# Patient Record
Sex: Male | Born: 1965 | Race: Black or African American | Hispanic: No | Marital: Married | Smoking: Current some day smoker
Health system: Southern US, Community
[De-identification: ages and names within clinical notes are randomized; demographics above are authoritative.]

## PROBLEM LIST (undated history)

## (undated) DIAGNOSIS — C61 Malignant neoplasm of prostate: Secondary | ICD-10-CM

## (undated) DIAGNOSIS — R351 Nocturia: Secondary | ICD-10-CM

## (undated) DIAGNOSIS — Z973 Presence of spectacles and contact lenses: Secondary | ICD-10-CM

## (undated) DIAGNOSIS — Z8619 Personal history of other infectious and parasitic diseases: Secondary | ICD-10-CM

## (undated) DIAGNOSIS — I252 Old myocardial infarction: Secondary | ICD-10-CM

## (undated) DIAGNOSIS — N401 Enlarged prostate with lower urinary tract symptoms: Secondary | ICD-10-CM

## (undated) DIAGNOSIS — A048 Other specified bacterial intestinal infections: Secondary | ICD-10-CM

## (undated) DIAGNOSIS — J309 Allergic rhinitis, unspecified: Secondary | ICD-10-CM

## (undated) DIAGNOSIS — E785 Hyperlipidemia, unspecified: Secondary | ICD-10-CM

## (undated) DIAGNOSIS — R3129 Other microscopic hematuria: Secondary | ICD-10-CM

## (undated) DIAGNOSIS — T7840XA Allergy, unspecified, initial encounter: Secondary | ICD-10-CM

## (undated) DIAGNOSIS — R350 Frequency of micturition: Secondary | ICD-10-CM

## (undated) HISTORY — DX: Other microscopic hematuria: R31.29

## (undated) HISTORY — DX: Allergy, unspecified, initial encounter: T78.40XA

## (undated) HISTORY — PX: CARDIAC CATHETERIZATION: SHX172

## (undated) HISTORY — DX: Hyperlipidemia, unspecified: E78.5

## (undated) HISTORY — PX: PROSTATE BIOPSY: SHX241

## (undated) HISTORY — DX: Other specified bacterial intestinal infections: A04.8

## (undated) HISTORY — DX: Old myocardial infarction: I25.2

---

## 2000-08-23 ENCOUNTER — Encounter: Payer: Self-pay | Admitting: Family Medicine

## 2000-08-23 ENCOUNTER — Ambulatory Visit (HOSPITAL_COMMUNITY): Admission: RE | Admit: 2000-08-23 | Discharge: 2000-08-23 | Payer: Self-pay | Admitting: Family Medicine

## 2003-03-19 ENCOUNTER — Emergency Department (HOSPITAL_COMMUNITY): Admission: EM | Admit: 2003-03-19 | Discharge: 2003-03-19 | Payer: Self-pay | Admitting: Emergency Medicine

## 2004-03-21 ENCOUNTER — Emergency Department (HOSPITAL_COMMUNITY): Admission: EM | Admit: 2004-03-21 | Discharge: 2004-03-22 | Payer: Self-pay | Admitting: Emergency Medicine

## 2004-10-04 ENCOUNTER — Ambulatory Visit: Payer: Self-pay | Admitting: Internal Medicine

## 2005-02-25 ENCOUNTER — Emergency Department (HOSPITAL_COMMUNITY): Admission: EM | Admit: 2005-02-25 | Discharge: 2005-02-25 | Payer: Self-pay | Admitting: Family Medicine

## 2005-09-15 ENCOUNTER — Inpatient Hospital Stay (HOSPITAL_COMMUNITY): Admission: EM | Admit: 2005-09-15 | Discharge: 2005-09-16 | Payer: Self-pay | Admitting: Emergency Medicine

## 2005-09-15 ENCOUNTER — Ambulatory Visit: Payer: Self-pay | Admitting: Cardiology

## 2005-10-17 ENCOUNTER — Ambulatory Visit: Payer: Self-pay | Admitting: Internal Medicine

## 2005-10-26 ENCOUNTER — Ambulatory Visit: Payer: Self-pay | Admitting: Internal Medicine

## 2006-01-15 ENCOUNTER — Emergency Department (HOSPITAL_COMMUNITY): Admission: EM | Admit: 2006-01-15 | Discharge: 2006-01-15 | Payer: Self-pay | Admitting: Family Medicine

## 2006-06-12 ENCOUNTER — Ambulatory Visit: Payer: Self-pay | Admitting: Internal Medicine

## 2006-06-19 ENCOUNTER — Ambulatory Visit: Payer: Self-pay | Admitting: Internal Medicine

## 2006-06-19 LAB — CONVERTED CEMR LAB
ALT: 29 units/L (ref 0–40)
BUN: 13 mg/dL (ref 6–23)
Basophils Absolute: 0.1 10*3/uL (ref 0.0–0.1)
Bilirubin, Direct: 0.2 mg/dL (ref 0.0–0.3)
Calcium: 9 mg/dL (ref 8.4–10.5)
Cholesterol: 162 mg/dL (ref 0–200)
Eosinophils Absolute: 0.2 10*3/uL (ref 0.0–0.6)
GFR calc Af Amer: 105 mL/min
GFR calc non Af Amer: 87 mL/min
Glucose, Bld: 96 mg/dL (ref 70–99)
Ketones, ur: NEGATIVE mg/dL
LDL Cholesterol: 108 mg/dL — ABNORMAL HIGH (ref 0–99)
Leukocytes, UA: NEGATIVE
MCHC: 33.8 g/dL (ref 30.0–36.0)
MCV: 87.2 fL (ref 78.0–100.0)
Platelets: 395 10*3/uL (ref 150–400)
RBC: 4.57 M/uL (ref 4.22–5.81)
RDW: 11.6 % (ref 11.5–14.6)
Specific Gravity, Urine: >=1.03 (ref 1.000–1.03)
Total CHOL/HDL Ratio: 4.8
Triglycerides: 98 mg/dL (ref 0–149)
WBC: 5.1 10*3/uL (ref 4.5–10.5)
pH: 6 (ref 5.0–8.0)

## 2006-06-21 ENCOUNTER — Ambulatory Visit: Payer: Self-pay | Admitting: Internal Medicine

## 2006-11-27 ENCOUNTER — Ambulatory Visit: Payer: Self-pay | Admitting: Internal Medicine

## 2006-12-25 ENCOUNTER — Ambulatory Visit: Payer: Self-pay | Admitting: Internal Medicine

## 2006-12-25 LAB — CONVERTED CEMR LAB
Crystals: NEGATIVE
Ketones, ur: NEGATIVE mg/dL
Leukocytes, UA: NEGATIVE
Specific Gravity, Urine: 1.02 (ref 1.000–1.03)
Urine Glucose: NEGATIVE mg/dL
Urobilinogen, UA: 4 — AB (ref 0.0–1.0)
WBC, UA: NONE SEEN cells/hpf
pH: 7 (ref 5.0–8.0)

## 2007-05-23 ENCOUNTER — Telehealth (INDEPENDENT_AMBULATORY_CARE_PROVIDER_SITE_OTHER): Payer: Self-pay | Admitting: *Deleted

## 2007-05-23 ENCOUNTER — Ambulatory Visit: Payer: Self-pay | Admitting: Internal Medicine

## 2007-05-23 DIAGNOSIS — I252 Old myocardial infarction: Secondary | ICD-10-CM

## 2007-05-23 DIAGNOSIS — J309 Allergic rhinitis, unspecified: Secondary | ICD-10-CM | POA: Insufficient documentation

## 2007-05-23 DIAGNOSIS — E785 Hyperlipidemia, unspecified: Secondary | ICD-10-CM

## 2007-05-23 DIAGNOSIS — A048 Other specified bacterial intestinal infections: Secondary | ICD-10-CM | POA: Insufficient documentation

## 2007-05-23 DIAGNOSIS — K5289 Other specified noninfective gastroenteritis and colitis: Secondary | ICD-10-CM

## 2008-03-11 ENCOUNTER — Emergency Department (HOSPITAL_COMMUNITY): Admission: EM | Admit: 2008-03-11 | Discharge: 2008-03-11 | Payer: Self-pay | Admitting: Emergency Medicine

## 2008-03-11 ENCOUNTER — Telehealth: Payer: Self-pay | Admitting: Internal Medicine

## 2008-08-11 ENCOUNTER — Ambulatory Visit: Payer: Self-pay | Admitting: Internal Medicine

## 2008-12-30 ENCOUNTER — Telehealth: Payer: Self-pay | Admitting: Internal Medicine

## 2009-07-22 ENCOUNTER — Ambulatory Visit: Payer: Self-pay | Admitting: Internal Medicine

## 2009-07-22 DIAGNOSIS — J45909 Unspecified asthma, uncomplicated: Secondary | ICD-10-CM | POA: Insufficient documentation

## 2009-08-19 HISTORY — PX: ACHILLES TENDON REPAIR: SUR1153

## 2009-09-14 ENCOUNTER — Emergency Department (HOSPITAL_COMMUNITY)
Admission: EM | Admit: 2009-09-14 | Discharge: 2009-09-14 | Payer: Self-pay | Source: Home / Self Care | Admitting: Emergency Medicine

## 2009-10-01 ENCOUNTER — Encounter (INDEPENDENT_AMBULATORY_CARE_PROVIDER_SITE_OTHER): Payer: Self-pay | Admitting: Orthopedic Surgery

## 2009-10-01 ENCOUNTER — Ambulatory Visit: Payer: Self-pay | Admitting: Surgery

## 2009-10-01 ENCOUNTER — Encounter: Payer: Self-pay | Admitting: Internal Medicine

## 2009-10-01 ENCOUNTER — Ambulatory Visit: Admission: RE | Admit: 2009-10-01 | Discharge: 2009-10-01 | Payer: Self-pay | Admitting: Orthopedic Surgery

## 2009-10-05 ENCOUNTER — Ambulatory Visit: Payer: Self-pay | Admitting: Internal Medicine

## 2009-12-06 ENCOUNTER — Emergency Department (HOSPITAL_COMMUNITY): Admission: EM | Admit: 2009-12-06 | Discharge: 2009-12-06 | Payer: Self-pay | Admitting: Family Medicine

## 2010-01-08 ENCOUNTER — Encounter: Payer: Self-pay | Admitting: Internal Medicine

## 2010-01-08 ENCOUNTER — Ambulatory Visit: Payer: Self-pay | Admitting: Internal Medicine

## 2010-04-11 ENCOUNTER — Encounter: Payer: Self-pay | Admitting: Internal Medicine

## 2010-04-18 LAB — CONVERTED CEMR LAB
AST: 25 units/L (ref 0–37)
Albumin: 3.9 g/dL (ref 3.5–5.2)
Alkaline Phosphatase: 75 units/L (ref 39–117)
Basophils Absolute: 0 10*3/uL (ref 0.0–0.1)
Basophils Relative: 0.2 % (ref 0.0–3.0)
Bilirubin, Direct: 0.2 mg/dL (ref 0.0–0.3)
Calcium: 8.9 mg/dL (ref 8.4–10.5)
GFR calc non Af Amer: 117.53 mL/min (ref >60)
Hemoglobin: 13.7 g/dL (ref 13.0–17.0)
Lymphocytes Relative: 41.3 % (ref 12.0–46.0)
Monocytes Relative: 11.7 % (ref 3.0–12.0)
Neutro Abs: 2 10*3/uL (ref 1.4–7.7)
PSA: 1.32 ng/mL (ref 0.10–4.00)
RBC: 4.51 M/uL (ref 4.22–5.81)
Sodium: 142 meq/L (ref 135–145)
TSH: 1.08 microintl units/mL (ref 0.35–5.50)
Total CHOL/HDL Ratio: 5
Total Protein: 7.4 g/dL (ref 6.0–8.3)
VLDL: 17.2 mg/dL (ref 0.0–40.0)
WBC: 5 10*3/uL (ref 4.5–10.5)

## 2010-04-20 NOTE — Letter (Signed)
Summary: [facility]   Imported By: Maryln Gottron 10/27/2009 13:11:41  _____________________________________________________________________  External Attachment:    Type:   Image     Comment:   External Document

## 2010-04-20 NOTE — Assessment & Plan Note (Signed)
Summary: 3 month follow up/mhf   Vital Signs:  Patient profile:   45 year old male Height:      67 inches Weight:      184.50 pounds BMI:     29.00 O2 Sat:      99 % on Room air Temp:     98.0 degrees F oral Pulse rate:   76 / minute Pulse rhythm:   regular Resp:     20 per minute BP sitting:   102 / 78  (right arm) Cuff size:   large  Vitals Entered By: Glendell Docker CMA (January 08, 2010 10:11 AM)  O2 Flow:  Room air CC: 3 Month follow up , asthma Is Patient Diabetic? No Pain Assessment Patient in pain? no        Primary Care Provider:  Dondra Spry DO  CC:  3 Month follow up  and asthma.  History of Present Illness:       This is a 45 year old male who presents with asthma.  The patient complains of history of diagnosed Asthma and wheezing.  Previous effective treatment includes short acting beta-agonist:.  he has not been using qvar as directed.   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 Pylori  Past Surgical History: left achilles tendon repair 08/2009  - Dr. 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  Physical Exam  General:  alert, well-developed, and well-nourished.   Lungs:  normal respiratory effort and normal breath sounds.   Heart:  normal rate, regular rhythm, and no gallop.   Extremities:  no edema    Impression & Recommendations:  Problem # 1:  ASTHMA  (ICD-493.90) spirometry confirms mild obstruction.  stressed importance of using maintenace inhaler daily  The following medications were removed from the medication list:    Qvar 40 Mcg/act Aers (Beclomethasone dipropionate) .Marland Kitchen... 2 puffs two times a day His updated medication list for this problem includes:    Proair Hfa 108 (90 Base) Mcg/act Aers (Albuterol sulfate) .Marland Kitchen... 2 puffs qid prn    Dulera 100-5 Mcg/act Aero (Mometasone furo-formoterol fum) .Marland Kitchen... 2 puffs two times a day  Complete Medication List: 1)  Proair Hfa 108 (90 Base) Mcg/act Aers (Albuterol sulfate) .... 2 puffs qid prn 2)  Zyrtec Allergy 10 Mg Tbdp (Cetirizine hcl) .... Take 1 tablet by mouth once a day as needed 3)  Dulera 100-5 Mcg/act Aero (Mometasone furo-formoterol fum) .... 2 puffs two times a day  Other Orders: Flu Vaccine 66yrs + ([address]) Admin 1st Vaccine ([address])  Patient Instructions: 1)  Please schedule a follow-up appointment in 6 months. 2)  Allergy panel blood test - 1 week prior to next appointment Prescriptions: DULERA 100-5 MCG/ACT AERO (MOMETASONE FURO-FORMOTEROL FUM) 2 puffs two times a day  #1 x 5   Entered and Authorized by:   D.  Thomos Lemons DO   Signed by:   D. Thomos Lemons DO on 01/08/2010   Method used:   Electronically to        [facility] (retail)       [address]       [place], [place]  [address]       Ph: [phone]       Fax: [phone]   RxID:   [id number]    Orders Added: 1)  Flu Vaccine 6yrs + [[address]] 2)  Admin 1st Vaccine [90471] 3)  Est. Patient Level [name] [02774]   Immunizations Administered:  Influenza Vaccine # 1:    Vaccine Type: Fluvax 3+    Site: left deltoid    Mfr: GlaxoSmithKline    Dose: 0.5 ml    Route: IM    Given by: Glendell Docker CMA    Exp. Date: 09/18/2010    Lot #: [id number]    VIS given: 10/13/09 version given January 08, 2010.  Flu Vaccine Consent Questions:    Do you have a history of severe allergic  reactions to this vaccine? no    Any prior history of allergic reactions to egg and/or gelatin? no    Do you have a sensitivity to the preservative Thimersol? no    Do you have a past history of Guillan-Barre Syndrome? no    Do you currently have an acute febrile illness? no    Have you ever had a severe reaction to latex? no    Vaccine information given and explained to patient? yes   Immunizations Administered:  Influenza Vaccine # 1:    Vaccine Type: Fluvax 3+    Site: left deltoid    Mfr: GlaxoSmithKline    Dose: 0.5 ml    Route: IM    Given by: Glendell Docker CMA    Exp. Date: 09/18/2010    Lot #: [id number]    VIS given: 10/13/09 version given January 08, 2010.  Current Allergies (reviewed today): No known allergies

## 2010-04-20 NOTE — Assessment & Plan Note (Signed)
Summary: having trouble breathing out of asthma meds/dt   Vital Signs:  Patient profile:   45 year old male Height:      67 inches Weight:      186 pounds BMI:     29.24 O2 Sat:      97 % on Room air Temp:     97.7 degrees F oral Pulse rate:   86 / minute Pulse rhythm:   regular Resp:     20 per minute BP sitting:   108 / 78  (left arm) Cuff size:   large  Vitals Entered By: Glendell Docker CMA (Jul 22, 2009 11:32 AM)  O2 Flow:  Room air CC: Rm 3- Asthma flare up Is Patient Diabetic? No  Does patient need assistance? 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  History of Present Illness:       The patient presents with asthma exacerbation.  The patient complains of history of diagnosed Asthma, chest tightness, wheezing, and mucous production.  Associated symptoms include nasal congestion.  Precipitating event is recent infection.    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  Physical Exam  General:  alert, well-developed, and well-nourished.   Head:  normocephalic and atraumatic.   Ears:  R ear normal and L ear normal.   Mouth:  pharyngeal erythema.   Neck:  No deformities, masses, or tenderness noted. Lungs:   normal respiratory effort.  faint scattered exp wheeze Heart:  normal rate, regular rhythm, and no gallop.   Extremities:  no edema  Neurologic:  cranial nerves II-XII intact and gait normal.     Impression & Recommendations:  Problem # 1:  ASTHMA (ICD-493.90) Assessment Deteriorated Pt with ashtma flare.  I suspect triggered by URI.  start symbicord.  tx with abx and prednisone taper.  Patient advised to call office if symptoms persist or worsen.  His updated medication list for this problem includes:    Proair Hfa 108 (90 Base) Mcg/act Aers (Albuterol sulfate) .Marland Kitchen... 2 puffs qid prn    Symbicort 80-4.5 Mcg/act Aero (Budesonide-formoterol fumarate) .Marland Kitchen... 2 puffs two times a day    Prednisone 10 Mg Tabs (Prednisone) .Marland KitchenMarland Kitchen[facility] Kitchen[facility] Kitchen 3 tabs by mouth once daily x 3 days, 2 tabs by mouth once daily x 3 days, 1 tab by mouth once daily x 3 days  Complete Medication List: 1)  Proair Hfa 108 (90 Base) Mcg/act Aers (Albuterol sulfate) .... 2 puffs qid prn 2)  Zyrtec Allergy 10 Mg Tbdp (Cetirizine hcl) .... Take 1 tablet by mouth once a day as needed 3)  Symbicort 80-4.5 Mcg/act Aero (Budesonide-formoterol fumarate) .... 2 puffs two times a day 4)  Prednisone 10 Mg Tabs (Prednisone) .Marland KitchenMarland Kitchen[facility]land Kitchen  3 tabs by mouth once daily x 3 days, 2 tabs by mouth once daily x 3 days, 1 tab by mouth once daily x 3 days 5)  Azithromycin 250 Mg Tabs (Azithromycin) .... 2 tabs on day one, then one by mouth qd  Patient Instructions: 1)  Please schedule a follow-up appointment in 2 months. Prescriptions: AZITHROMYCIN 250 MG TABS (AZITHROMYCIN) 2 tabs on day one, then one by mouth qd  #6 x 0   Entered and Authorized by:   D. Thomos Lemons DO   Signed by:   D. Thomos Lemons DO on 07/22/2009   Method used:   Electronically to        [facility] (retail)       [address]       [place], [place]  [address]       Ph: [phone]       Fax: [phone]   RxID:   [id number] PREDNISONE 10 MG TABS  (PREDNISONE) 3 tabs by mouth once daily x 3 days, 2 tabs by mouth once daily x 3 days, 1 tab by mouth once daily x 3 days  #18 x 0   Entered and Authorized by:   D. Thomos Lemons DO   Signed by:   D. Thomos Lemons DO on 07/22/2009   Method used:   Electronically to        [facility] (retail)       [address]       [place], [place]  [address]       Ph: [phone]       Fax: [phone]   RxID:   [id number] PROAIR HFA 108 (90 BASE) MCG/ACT AERS (ALBUTEROL SULFATE) 2 puffs qid prn  #1 x 2   Entered and Authorized by:   D. Thomos Lemons DO   Signed by:   D. Thomos Lemons DO on 07/22/2009   Method used:   Electronically to        [facility] (retail)       [address]       [place], [place]  [address]       Ph: [phone]       Fax: [phone]   RxID:   [id number] SYMBICORT 80-4.5 MCG/ACT AERO (BUDESONIDE-FORMOTEROL FUMARATE) 2 puffs two times a day  #1 x 3   Entered and Authorized by:   D. Thomos Lemons DO   Signed by:   D. Thomos Lemons DO on 07/22/2009   Method used:   Electronically to        [facility] (retail)       [address]       [place], [place]  [address]       Ph: [phone]       Fax: [phone]   RxID:   [id number]       Current Allergies (reviewed today): No known allergies

## 2010-04-20 NOTE — Assessment & Plan Note (Signed)
Summary: 2 month fu/dt   Vital Signs:  Patient profile:   45 year old male O2 Sat:      97 % on Room air Temp:     98.0 degrees F oral Pulse rate:   78 / minute Pulse rhythm:   regular Resp:     16 per minute BP sitting:   100 / 70  (left arm) Cuff size:   large  Vitals Entered By: Glendell Docker CMA (October 05, 2009 9:38 AM)  O2 Flow:  Room air CC: Rm 2- 2 Month Follow Is Patient Diabetic? No Comments left foot injury, tore achilles tendon, starts physical therapy tomorrow   Primary Care Provider:  DThomos Lemons DO  CC:  Rm 2- 2 Month Follow.  History of Present Illness: 45 y/o male for asthma f/u no shortness of breath no wheezing  Interval hx:  tore left achilles tendon surgical repair by Dr. 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 Pylori  Past Surgical History: left achilles tendon repair 08/2009  - Dr. 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   Lungs:  normal respiratory effort and normal breath sounds.   Heart:  normal rate, regular rhythm, and no gallop.   Extremities:  no edema    Impression & Recommendations:  Problem # 1:  ASTHMA (ICD-493.90) Assessment Improved continue maintenance inhaler  The following medications were removed from the medication list:    Prednisone 10 Mg Tabs (Prednisone) .Marland KitchenMarland KitchenMarland KitchenMarland Kitchen 3 tabs  by mouth once daily x 3 days, 2 tabs by mouth once daily x 3 days, 1 tab by mouth once daily x 3 days His updated medication list for this problem includes:    Proair Hfa 108 (90 Base) Mcg/act Aers (Albuterol sulfate) .Marland Kitchen... 2 puffs qid prn    Qvar 40 Mcg/act Aers (Beclomethasone dipropionate) .Marland Kitchen... 2 puffs two times a day  Complete Medication List: 1)  Proair Hfa 108 (90 Base) Mcg/act Aers (Albuterol sulfate) .... 2 puffs qid prn 2)  Zyrtec Allergy 10 Mg Tbdp (Cetirizine hcl) .... Take 1 tablet by mouth once a day as needed 3)  Qvar 40 Mcg/act Aers (Beclomethasone dipropionate) .... 2 puffs two times a day 4)  Oxycodone-acetaminophen 5-325 Mg Tabs (Oxycodone-acetaminophen) .Marland Kitchen.. 1-2 tablets by mouth every 4 hours as needed pain 5)  Aspirin 325 Mg Tabs (Aspirin) .... Take 1 tablet by mouth once a day  Patient Instructions: 1)  Please schedule a follow-up appointment in 4 months. Prescriptions: QVAR 40 MCG/ACT AERS (BECLOMETHASONE DIPROPIONATE) 2 puffs two times a day  #1 x 3   Entered and Authorized by:   D. Thomos Lemons DO   Signed by:   D. Thomos Lemons DO on 10/05/2009   Method used:   Electronically to        [facility] (  retail)       [address]       [place], [place]  [address]       Ph: [phone]       Fax: [phone]   RxID:   [id number]   Current Allergies (reviewed today): No known allergies

## 2010-06-07 ENCOUNTER — Ambulatory Visit (INDEPENDENT_AMBULATORY_CARE_PROVIDER_SITE_OTHER): Payer: Private Health Insurance - Indemnity | Admitting: Internal Medicine

## 2010-06-07 ENCOUNTER — Encounter: Payer: Self-pay | Admitting: Internal Medicine

## 2010-06-07 DIAGNOSIS — J45909 Unspecified asthma, uncomplicated: Secondary | ICD-10-CM

## 2010-06-07 DIAGNOSIS — L0233 Carbuncle of buttock: Secondary | ICD-10-CM

## 2010-06-21 ENCOUNTER — Ambulatory Visit: Payer: Self-pay | Admitting: Internal Medicine

## 2010-06-22 NOTE — Assessment & Plan Note (Signed)
Summary: refill asthama meds.Raymond Torres   Vital Signs:  Patient profile:   45 year old male Height:      67 inches Weight:      179.50 pounds BMI:     28.22 O2 Sat:      100 % on Room air Temp:     97.7 degrees F oral Pulse rate:   66 / minute Resp:     18 per minute BP sitting:   100 / 80  (left arm) Cuff size:   large  Vitals Entered By: Glendell Docker CMA (June 07, 2010 10:08 AM)  O2 Flow:  Room air CC: Medication Refill, asthma Is Patient Diabetic? No Pain Assessment Patient in pain? no        Primary Care Provider:  Dondra Spry DO  CC:  Medication Refill and asthma.  History of Present Illness:       This is a 45 year old male who presents with asthma.  The patient denies cough, shortness of breath, chest tightness, wheezing, and mucous production.  Previous effective treatment includes ICS + LABA.  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 Pylori   Past Surgical History: left achilles tendon repair 08/2009  - Dr. 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  Physical Exam  General:  alert, well-developed, and well-nourished.   Ears:  R ear normal and L ear normal.   Mouth:  pharynx pink and moist.   Lungs:  normal respiratory effort and normal  breath sounds.   Heart:  normal rate, regular rhythm, and no gallop.   Skin:  small boil at crease.  no surrounding erythema.     Impression & Recommendations:  Problem # 1:  CARBUNCLE, BUTTOCK (ICD-680.5) Assessment New small boil. use sitz bath Patient advised to call office if symptoms persist or worsen.  Problem # 2:  ASTHMA (ICD-493.90) Assessment: Improved good response to dulera.  Maintain current medication regimen.  His updated medication list for this problem includes:    Proair Hfa 108 (90 Base) Mcg/act Aers (Albuterol sulfate) .Marland Kitchen... 2 puffs qid prn    Dulera 100-5 Mcg/act Aero (Mometasone furo-formoterol fum) .Marland Kitchen... 2 puffs two times a day  Complete Medication List: 1)  Proair Hfa 108 (90 Base) Mcg/act Aers (Albuterol sulfate) .... 2 puffs qid prn 2)  Allegra Allergy 180 Mg Tabs (Fexofenadine hcl) .... One by mouth once daily 3)  Dulera 100-5 Mcg/act Aero (Mometasone furo-formoterol fum) .... 2 puffs two times a day  Patient Instructions: 1)  Soak in sitz bath once daily x 3-5 days 2)  Call our office if your symptoms do not  improve or gets worse. 3)  Use fexofenadine 180 mg once daily  4)  Please schedule a follow-up appointment in 6 months. Prescriptions: DULERA 100-5 MCG/ACT AERO (MOMETASONE FURO-FORMOTEROL FUM) 2 puffs two times a day  #1 x 5   Entered and Authorized by:   D. Thomos Lemons DO   Signed by:   D. Thomos Lemons DO on 06/07/2010   Method used:   Print then Give to Patient   RxID:   [id number]    Orders Added: 1)  Est. Patient Level [name] [[address]]

## 2010-07-29 ENCOUNTER — Encounter: Payer: Self-pay | Admitting: Internal Medicine

## 2010-07-30 ENCOUNTER — Ambulatory Visit: Payer: Private Health Insurance - Indemnity | Admitting: Internal Medicine

## 2010-07-30 ENCOUNTER — Ambulatory Visit (INDEPENDENT_AMBULATORY_CARE_PROVIDER_SITE_OTHER): Payer: Private Health Insurance - Indemnity | Admitting: Family

## 2010-07-30 ENCOUNTER — Other Ambulatory Visit: Payer: Self-pay | Admitting: Family

## 2010-07-30 ENCOUNTER — Encounter: Payer: Self-pay | Admitting: Family

## 2010-07-30 VITALS — BP 108/78 | HR 78 | Temp 97.8°F | Resp 16 | Ht 67.01 in | Wt 181.1 lb

## 2010-07-30 DIAGNOSIS — N390 Urinary tract infection, site not specified: Secondary | ICD-10-CM | POA: Insufficient documentation

## 2010-07-30 DIAGNOSIS — R300 Dysuria: Secondary | ICD-10-CM

## 2010-07-30 LAB — POCT URINALYSIS DIPSTICK
Glucose, UA: NEGATIVE
Nitrite, UA: NEGATIVE
Urobilinogen, UA: 0.2

## 2010-07-30 MED ORDER — CIPROFLOXACIN HCL 500 MG PO TABS: 500.0000 mg | ORAL_TABLET | Freq: Two times a day (BID) | ORAL | Status: AC

## 2010-07-30 NOTE — Progress Notes (Signed)
Addended by: Mervin Kung on: 07/30/2010 09:40 AM   Modules accepted: Orders

## 2010-07-30 NOTE — Progress Notes (Signed)
  Subjective:    Patient ID: Raymond Torres, male    DOB: Oct 16, 1965, 45 y.o.   MRN: [id number]  HPI  Mr.  Torres is a 45 yr old male with complaint of urinary frequency, urgency, and dysuria. Symptoms started 1 week ago.  Denies associated low back pain, hematuria or fever.  Denies new sexual partners. No history of similar symptoms and more  specifically denies hx of kidney stones.    Review of Systems  See HPI  Past Medical History  Diagnosis Date  . Asthma childhood  . Hyperlipidemia   . 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 Microscopic hematuria     history of  . Helicobacter pylori (H. pylori)     history of  . Allergy     rhinitis    History   Social History  . Marital Status: Married    Spouse Name: N/A    Number of Children: 3  . Years of Education: N/A   Occupational History  . Manager    Social History Main Topics  . Smoking status: Never Smoker   . Smokeless tobacco: Not on file  . Alcohol Use: Yes  . Drug Use: No  . Sexually Active:    Other Topics Concern  . Not on file   Social History Narrative  . No narrative on file    Past Surgical History  Procedure Date  . Achilles tendon repair 08/2009    Dr August Saucer    Family History  Problem Relation Age of Onset  . Hypertension Mother   . Cancer Father     lung and prostate  . Cancer Other     prostate    No Known Allergies  Current Outpatient Prescriptions on File Prior to Visit  Medication Sig Dispense Refill  . albuterol (PROAIR HFA) 108 (90 BASE) MCG/ACT inhaler Inhale 2 puffs into the lungs every 6 (six) hours as needed.        . cetirizine (ZYRTEC) 10 MG tablet Take 10 mg by mouth daily as needed.        . Mometasone Furo-Formoterol Fum (DULERA) 100-5 MCG/ACT AERO Inhale 2 puffs into the lungs 2 (two) times daily.          BP 108/78  Pulse 78  Temp(Src) 97.8 F (36.6 C) (Oral)  Resp 16  Ht 5' 7.01" (1.702 m)  Wt 181 lb 1.9 oz (82.155 kg)  BMI  28.36 kg/m2        Objective:   Physical Exam  Gen: awake, alert, NAD CV:  S1, S2, RRR, no murmur Resp:  BS CTA bilaterally, no wheezes, rales or rhonchi. Abd: soft, non-tender, non-distended, + BS GU: neg CVAT bilaterally          Assessment & Plan:

## 2010-07-30 NOTE — Assessment & Plan Note (Signed)
UA suggestive or UTI.  + microscopic  hematuria.  Will send for culture including GC/Chlamydia screen.  Pt is a non-smoker and without back pain, fever or CVAT.  Will defer CT scan at this time, however if symptoms fail to improve, he will ultimately need CT with kidney stone protocol.  Pt is aware of plan and is instructed to follow up with Dr. Artist Pais in 2 weeks, sooner if symptoms worsen or if they do not improve.

## 2010-07-30 NOTE — Patient Instructions (Signed)
Please call if your symptoms worsen, if you develop fever over 101, low back pain, or difficulty urinating. Follow up with Dr. Artist Pais in 2 weeks.

## 2010-07-31 LAB — GC/CHLAMYDIA PROBE AMP, URINE: GC Probe Amp, Urine: NEGATIVE

## 2010-08-02 LAB — URINE CULTURE: Colony Count: >=100000

## 2010-08-03 NOTE — Assessment & Plan Note (Signed)
 HEALTHCARE                         ELECTROPHYSIOLOGY OFFICE NOTE   Raymond Torres, Raymond Torres                         MRN:          [id number]  DATE:11/27/2006                            DOB:          09-15-1965    Raymond Torres returns today for followup.  He is a very pleasant, 45-year-  old male with a non-Q-wave MI sustained over a year ago with  catheterization demonstrating normal LV function and no evidence for  coronary artery disease.  I saw him most recently back in August 2007,  and he returns today for followup.  At that time, we had him on calcium  channel blockers because of concerns about coronary spasm.  These have  been discontinued and he has had no recurrent symptoms.  He is on an  enteric coated aspirin daily.  Otherwise, he had no specific complaints  today.   PHYSICAL EXAMINATION:  GENERAL:  He is a pleasant, well-appearing, young  man in no distress.  VITAL SIGNS:  Blood pressure 132/78, pulse 73 and regular, respirations  18.  Weight 171 pounds.  NECK:  No jugular venous distention.  LUNGS:  Clear to auscultation bilaterally.  No wheezes, rales or rhonchi  were present.  CARDIAC:  Regular rate and rhythm with normal S1, S2, no murmurs, rubs  or gallops.  PMI was not enlarged nor laterally displaced.  ABDOMEN:  Soft, nontender, nondistended.  There is no organomegaly.  EXTREMITIES:  No clubbing, cyanosis or edema.   EKG demonstrates sinus rhythm with normal axis intervals.   IMPRESSION:  1. Remote non-Q-wave myocardial infarction with normal left      ventricular function and normal coronaries.  2. Dyslipidemia with the patient intolerant of statins.   DISCUSSION:  Overall, Raymond Torres is stable.  I have recommended that he  continue his low dose aspirin.  We will see him back on a p.r.n. basis  as he will follow up with his primary physician, Dr. Thomos Lemons.     Doylene Canning. Ladona Ridgel, MD  Electronically Signed    GWT/MedQ  DD:  11/27/2006  DT: 11/28/2006  Job #: [id number]

## 2010-08-06 NOTE — Assessment & Plan Note (Signed)
[facility]                              CARDIOLOGY OFFICE NOTE   AWAIS, COBARRUBIAS                         MRN:          [id number]  DATE:10/26/2005                            DOB:          1965-10-04    Mr. Raymond Torres returns today for followup.  He is a very pleasant, 45 year old  man with a history of recent non-Q wave MI for which he underwent  catheterization demonstrating normal LV function and coronary artery  disease.  He was discharged home.  He has dyslipidemia, but is intolerant of  Lipitor having back pain on it.  He has stopped his Lipitor.  He has had no  recurrent chest pain.   PHYSICAL EXAMINATION:  GENERAL:  He is a pleasant, well-appearing man in no  distress.  VITAL SIGNS:  Blood pressure 104/60, pulse 60 and regular, respirations 18,  weight 167 pounds.  NECK:  No jugular venous distention.  LUNGS:  Clear to auscultation bilaterally.  CARDIAC:  Regular rate and rhythm with normal S1, S2.  EXTREMITIES:  No edema.   EKG demonstrates sinus rhythm.   IMPRESSION:  1.  Status post non-Q-wave myocardial infarction.  2.  Clean coronaries with consideration for possible event being due to      coronary spasm.  3.  Back pain on Lipitor.  4.  Dyslipidemia.   DISCUSSION:  I have recommended that we discontinue his Lipitor for now,  that he maintain a low-fat diet and that he stay on his Cardizem for at  least a year.  We will see him back in a year and think about discontinuing  his Cardizem if he has no other symptoms other than spasm.                                  Doylene Canning. Ladona Ridgel, MD    GWT/MedQ  DD:  10/26/2005  DT:  10/26/2005  Job #:  [id number]

## 2010-08-06 NOTE — Discharge Summary (Signed)
NAMESUNG, PARODI NO.:  [id number]   MEDICAL RECORD NO.:  [id number]          PATIENT TYPE:  INP   LOCATION:  2004                         FACILITY:  [facility]   PHYSICIAN:  Dorian Pod, NP    DATE OF BIRTH:  [birth year]   DATE OF ADMISSION:  09/14/2005  DATE OF DISCHARGE:  09/16/2005                                 DISCHARGE SUMMARY   DISCHARGE DIAGNOSES:  1.  Non-ST elevated myocardial infarction, status post cardiac      catheterization on September 15, 2005.  Patient with a normal ejection      fraction of 60%, angiographically normal coronaries.  Etiology of      elevated troponin/myocardial infarction not certain.  Questionable      coronary spasms.  D-dimer within normal limits.  2.  Mild hypercholesterolemia, with an LDL of 108 and triglycerides within      normal limits.   PAST MEDICAL HISTORY:  As above.  Recent urinary tract infection.   PROCEDURES:  Cardiac catheterization as stated above.   HISTORY OF PRESENT ILLNESS:  Mr. Raymond Torres is a 45 year old African American  gentleman with no known history of coronary artery disease, only history of  asthma, who presented to [facility] emergency room on day of admission  complaining of chest pain that started around 8 o'clock that night while he  was working.  He is a Naval architect.  The pain came very intense.  It  was associated with shortness of breath, nausea.  Relieved with  nitroglycerine in the ER.  Initial EKG at a rate of 51 sinus rhythm.  H&H  within normal limits.  Chemistry within normal limits.  Second troponin  1.11, D-dimer less than 0.22, urinalysis positive for leukocyte.  The  patient was admitted with an non-ST elevated MI, treated with heparin,  aspirin, beta blocker.  Continued Cipro, as he was previously on this for  UTI.   ALLERGIES:  Patient with known allergy to SHELL FISH.   MEDICATIONS:  Previous medication was Solu-Medrol, prednisone, Benadryl,  Pepcid.   To the cath lab  on September 15, 2005, results stated above.  Patient had no  further episodes of chest discomfort.  EKG showed a normal sinus rhythm.  Patient being discharged home after being seen by Dr. Lanice Shirts.  At this  time, we will medicate with Cardizem for possible coronary spasms.   At the time of discharge, afebrile, pulse of 88, blood pressure 104/64.  The  patient sat 98% on room air.   LAB WORK:  Showing a hemoglobin of 14, hematocrit 41.56, WBC of 13.6,  platelet count 304,000.  D-dimer less than 0.22 x2.  Sodium 136, potassium  4.1, chloride 108, CO2 of 22, glucose elevated at 186.  Patient to followup  with his primary care physician regarding elevated glucose.  BUN 14,  creatinine 1.1, troponin peak at 1.50, with a CK MB of 18.8 and an index of  4.7.  Total cholesterol 149, HDL 34, LDL 108, and triglycerides 34.   DISPOSITION:  The patient home with wife.  He has been  giving a postcardiac  catheterization discharge instructions.  He has also been given a post  myocardial infarction instructions regarding his medication, and a article  for treatment of elevated cholesterol.   MEDICATIONS AT DISCHARGE:  Include prescriptions for:  1.  Nitroglycerine sublingual as needed.  2.  Cardizem CD 180 mg daily.  3.  Lipitor 40 mg at bedtime.  4.  Patient to continue Cipro as previously prescribed.  5.  He will use over-the-counter aspirin 81 mg daily.   The patient is instructed to avoid caffeine, decongestants, any stimulants.  He is scheduled for blood work July 30th, 8:30 a.m., for lipids and liver.  He has been instructed not to eat or drink anything 8 hours before.  Followup with Dr. Ladona Ridgel August 8th at 10:45.  If the patient has any  problems from the cath site before then, he can call our office at [phone].      Dorian Pod, NP     MB/MEDQ  D:  09/16/2005  T:  09/16/2005  Job:  [phone] [id number]

## 2010-08-06 NOTE — H&P (Signed)
NAME:  Raymond Torres, TRICKEL                ACCOUNT NO.:  [id number]   MEDICAL RECORD NO.:  [id number]          PATIENT TYPE:  EMS   LOCATION:  MAJO                         FACILITY:  [facility]   PHYSICIAN:  Leonard Downing. Pernell Dupre, M.D.  DATE OF BIRTH:  May 10, 1965   DATE OF ADMISSION:  09/14/2005  DATE OF DISCHARGE:                                HISTORY & PHYSICAL   CHIEF COMPLAINT:  Chest pain.   HISTORY OF PRESENT ILLNESS:  This is a 45 year old African American male  with a history of asthma and complaining of chest pain.  The patient states  that at 8:00 p.m. tonight he was working at [employer], developed chest  pain described as deep, sharp, 10/10, associated with shortness of breath,  nausea, no vomiting and no diaphoresis.  The pain radiated to his left arm.  He has never had the pain before.  No PND.  No orthopnea.  No lower  extremity edema.  No presyncope or syncope.  The patient states that the  pain lasted for about 1 hour until he came to the ER and received  nitroglycerin.   ALLERGIES:  NO KNOWN DRUG ALLERGIES.   MEDICATIONS:  The only medication that the patient is on is ciprofloxacin.   PAST MEDICAL HISTORY:  1.  Asthma.  2.  Urinary tract infection.   SOCIAL HISTORY:  Lives in [place] with his wife.  He is a Psychologist, occupational.  He has two healthy children.  No tobacco.  No alcohol.  No drugs.   FAMILY HISTORY:  Mother is alive at age 33 with thyroid issues.  Father died  in his 16's for cancer.  One brother who is healthy.   REVIEW OF SYSTEMS:  Chest pain, shortness of breath and nausea.  All other  systems are negative.   PHYSICAL EXAMINATION:  VITAL SIGNS:  Temperature 98.2, pulse 59, respiratory  rate 18, blood pressure 126/89.  GENERAL:  Alert and oriented times 3, in no apparent distress.  HEENT:  Pupils equal, round and reactive to light.  Extraocular movements  intact.  NECK:  No lymphadenopathy and no carotid bruits.  CHEST:  Clear to auscultation  bilaterally.  CARDIOVASCULAR:  Normal S1, S2.  No murmurs, rubs or gallops.  ABDOMEN:  Soft, nontender and nondistended with positive bowel sounds.  EXTREMITIES:  No clubbing, cyanosis or edema.  Sharp radial, femoral and  pedal pulses.   DIAGNOSTIC DATA:  Chest x-ray is pending.  EKG, rate 51, sinus, axis 56,  interval PR 142, QRS 108, QTc of 391.   LABORATORY DATA:  H&H 14 and 42.  Sodium 138, potassium 3.6, BUN of 20,  creatinine 1.2, glucose 103.  MB first was 2.4, then bumped to 7.6.  Troponin-I first 0.05, then bumped to 1.11.  D-dimer less than 0.22.  Urinalysis -- positive leukocytes with some occasional bacteria.   ASSESSMENT:  Non-ST elevation myocardial infarction.   PLAN:  1.  Start heparin drip, nitroglycerin drip, aspirin and start metoprolol      12.5.  Plan for cath in the a.m. and make him NPO.  2.  Check a lipid panel and check a TSH.  3.  Place on telemetry.  Continue to check on enzymes.  Check a urinary      tract screen.  4.  Urinary tract infection.  Continue ciprofloxacin.  5.  He is full code.           ______________________________  Leonard Downing. Pernell Dupre, M.D.     Loralie Champagne  D:  09/15/2005  T:  09/15/2005  Job:  [id number]

## 2010-08-06 NOTE — Cardiovascular Report (Signed)
NAME:  Raymond Torres, LANZ NO.:  [id number]   MEDICAL RECORD NO.:  [id number]          PATIENT TYPE:  INP   LOCATION:  3302                         FACILITY:  [facility]   PHYSICIAN:  Jonelle Sidle, M.D. LHCDATE OF BIRTH:  August 31, 1965   DATE OF PROCEDURE:  09/15/2005  DATE OF DISCHARGE:                              CARDIAC CATHETERIZATION   REQUESTING CARDIOLOGIST:  Dr. Lewayne Bunting   INDICATIONS:  Mr. Hessel is a 44 year old male with available history  indicating asthma and recent chest pain syndrome.  He has ruled in for a non-  ST-elevation myocardial infarction based on abnormal troponin I levels and  is referred now for diagnostic coronary angiography to clearly assess the  coronary anatomy.   Dictation ended at this point.      Jonelle Sidle, M.D. [facility]     SGM/MEDQ  D:  09/15/2005  T:  09/15/2005  Job:  [id number] [id number]

## 2010-08-06 NOTE — Cardiovascular Report (Signed)
NAME:  Raymond Torres, Raymond Torres NO.:  [id number]   MEDICAL RECORD NO.:  [id number]          PATIENT TYPE:  INP   LOCATION:  2004                         FACILITY:  [facility]   PHYSICIAN:  Jonelle Sidle, MD DATE OF BIRTH:  27-Jan-1966   DATE OF PROCEDURE:  01/04/2006  DATE OF DISCHARGE:  09/16/2005                              CARDIAC CATHETERIZATION   INDICATIONS:  Mr. Main is a 44 year old male with available history  indicating asthma, who was admitted to the hospital with complaints of chest  pain, and was ultimately noted to have abnormal cardiac markers suggesting  non-ST elevation myocardial infarction with a troponin I level of 1.5.  He  was evaluated by Dr. Ladona Ridgel and scheduled for cardiac catheterization to  define the coronary anatomy and assess for any revascularizations  strategies.  The risks and benefits were explained him in advance and  informed consent was obtained.   PROCEDURE PERFORMED:  1. Left heart catheterization  2. Selective coronary angiography.  3. Left ventriculography.   ACCESS AND EQUIPMENT:  The area about the right femoral artery was  anesthetized with 1% lidocaine and a 6-French sheath was placed in the right  femoral artery via the modified Seldinger technique.  Standard preformed 6-  [place], JL-4 and JR-4 catheters were used for selective coronary angiography  and angled pigtail catheter was used for left heart catheterization and left  ventriculography.  A total of 110 mL Omnipaque were used.  The patient  tolerated the procedure well without any complications.   HEMODYNAMICS:  Aorta 96/59 mmHg.  Left ventricle 93/6 mmHg.   ANGIOGRAPHIC FINDINGS:  1. The left main coronary artery is free of significant flow-limiting      coronary atherosclerosis and gives rise to left anterior descending and      circumflex coronary arteries.  2. Left anterior descending gives rise to 2 diagonal branches.  There is      no significant  flow-limiting coronary atherosclerosis noted.  3. The circumflex coronary artery gives rise to 2 obtuse marginal      branches.  There is no significant flow-limiting coronary      atherosclerosis noted.  4. The right coronary is dominant providing the posterior descending and      posterolateral branch as well as 2 right ventricular marginal branches.      There is no significant flow-limiting coronary atherosclerosis noted.   Ventriculography was performed in the RAO projection and reveals an ejection  fraction of approximately 60% with no focal anterior or inferior wall motion  abnormality and no significant mitral regurgitation.   DIAGNOSES:  1. Angiographically normal coronary arteries.  2. Left ventricular ejection fraction of approximately 60% with no focal      wall motion abnormality and left ventricular end-diastolic pressure of      6 mmHg, and no significant mitral vegetation.   DISCUSSION:  Reviewed results with the patient as well as with Dr. Ladona Ridgel.  Etiology of the elevated troponin I level is not certain.  Will check a  urine drug screen and D-dimer level and determine whether additional  evaluation such as a chest CT scan is needed.  He will otherwise be observed  in the hospital.      Jonelle Sidle, MD  Electronically Signed     SGM/MEDQ  D:  01/04/2006  T:  01/05/2006  Job:  [id number]

## 2010-12-01 ENCOUNTER — Ambulatory Visit (INDEPENDENT_AMBULATORY_CARE_PROVIDER_SITE_OTHER): Payer: BC Managed Care – PPO | Admitting: Internal Medicine

## 2010-12-01 ENCOUNTER — Encounter: Payer: Self-pay | Admitting: Internal Medicine

## 2010-12-01 VITALS — BP 134/80 | HR 80 | Temp 98.1°F | Wt 179.0 lb

## 2010-12-01 DIAGNOSIS — R519 Headache, unspecified: Secondary | ICD-10-CM | POA: Insufficient documentation

## 2010-12-01 DIAGNOSIS — N342 Other urethritis: Secondary | ICD-10-CM | POA: Insufficient documentation

## 2010-12-01 DIAGNOSIS — R51 Headache: Secondary | ICD-10-CM | POA: Insufficient documentation

## 2010-12-01 DIAGNOSIS — R300 Dysuria: Secondary | ICD-10-CM

## 2010-12-01 LAB — POCT URINALYSIS DIPSTICK
Leukocytes, UA: NEGATIVE
Nitrite, UA: NEGATIVE
Protein, UA: NEGATIVE
Urobilinogen, UA: 0.2

## 2010-12-01 MED ORDER — DOXYCYCLINE HYCLATE 100 MG PO TABS: 100.0000 mg | ORAL_TABLET | Freq: Two times a day (BID) | ORAL | Status: AC

## 2010-12-01 MED ORDER — TRAMADOL HCL 50 MG PO TABS: ORAL_TABLET | ORAL | Status: DC

## 2010-12-01 NOTE — Assessment & Plan Note (Signed)
New onset moderate to severe right headache in 45 year old African American male. Symptoms concerning for intracranial lesion. Headache is awakening patient at night.  Obtain MRI of brain.  Use tramadol as needed for now.

## 2010-12-01 NOTE — Progress Notes (Signed)
Subjective:    Patient ID: Raymond Torres, male    DOB: [birth year], 45 y.o.   MRN: [id number]  Headache  This is a new problem. The current episode started in the past 7 days. The problem occurs daily. The problem has been unchanged. The pain is located in the right unilateral region. The pain does not radiate. The pain quality is not similar to prior headaches. The quality of the pain is described as aching. The pain is at a severity of 8/10. The pain is severe. Pertinent negatives include no back pain, blurred vision, dizziness, ear pain, eye pain, eye redness, fever, hearing loss, loss of balance, nausea, neck pain, photophobia, scalp tenderness or swollen glands. The symptoms are aggravated by nothing. He has tried NSAIDs for the symptoms. The treatment provided no relief.   He also complains of dysuria. No new sexual partners.  UA only significant for trace blood.    Review of Systems  Constitutional: Negative for fever.  HENT: Negative for hearing loss, ear pain and neck pain.   Eyes: Negative for blurred vision, photophobia, pain and redness.  Gastrointestinal: Negative for nausea.  Musculoskeletal: Negative for back pain.  Neurological: Positive for headaches. Negative for dizziness and loss of balance.   Past Medical History  Diagnosis Date  . Asthma childhood  . Hyperlipidemia   . 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 Microscopic hematuria     history of  . Helicobacter pylori (H. pylori)     history of  . Allergy     rhinitis    History   Social History  . Marital Status: Married    Spouse Name: N/A    Number of Children: 3  . Years of Education: N/A   Occupational History  . Manager    Social History Main Topics  . Smoking status: Never Smoker   . Smokeless tobacco: Not on file  . Alcohol Use: Yes  . Drug Use: No  . Sexually Active:    Other Topics Concern  . Not on file   Social History Narrative  . No narrative on  file    Past Surgical History  Procedure Date  . Achilles tendon repair 08/2009    Dr August Saucer    Family History  Problem Relation Age of Onset  . Hypertension Mother   . Cancer Father     lung and prostate  . Cancer Other     prostate    No Known Allergies  Current Outpatient Prescriptions on File Prior to Visit  Medication Sig Dispense Refill  . albuterol (PROAIR HFA) 108 (90 BASE) MCG/ACT inhaler Inhale 2 puffs into the lungs every 6 (six) hours as needed.        . cetirizine (ZYRTEC) 10 MG tablet Take 10 mg by mouth daily as needed.        . Mometasone Furo-Formoterol Fum (DULERA) 100-5 MCG/ACT AERO Inhale 2 puffs into the lungs 2 (two) times daily.          Filed Vitals:   12/01/10 1810  BP: 134/80  Pulse: 80  Temp: 98.1 F (36.7 C)        Objective:   Physical Exam   Constitutional: Appears well-developed and well-nourished. No distress.  Head: Normocephalic and atraumatic.  Right Ear: External ear normal.  Left Ear: External ear normal.  Mouth/Throat: Oropharynx is clear and moist.  Eyes: Conjunctivae are normal. Pupils are equal, round, and reactive to light.  no visual field defect Neck: Normal range of motion. Neck supple. No thyromegaly present. No carotid bruit Cardiovascular: Normal rate, regular rhythm and normal heart sounds.  Exam reveals no gallop and no friction rub.   No murmur heard. Pulmonary/Chest: Effort normal and breath sounds normal.  No wheezes. No rales.  Abdominal: Soft. Bowel sounds are normal. No mass. There is no tenderness.  Neurological: Alert. No cranial nerve deficit.  negative cerebellar signs,  upper and lower extremity reflexes equal and symmetric Skin: Skin is warm and dry.  Psychiatric: Normal mood and affect. Behavior is normal.       Assessment & Plan:

## 2010-12-01 NOTE — Patient Instructions (Signed)
Please call our office if your symptoms do not improve or gets worse.  

## 2010-12-01 NOTE — Assessment & Plan Note (Signed)
45 year old [place] American male with urethritis. UA is unremarkable. Send urine for chlamydia probe. Empiric treatment with doxycycline 100 mg twice daily x10 days

## 2010-12-02 ENCOUNTER — Other Ambulatory Visit: Payer: Self-pay | Admitting: Internal Medicine

## 2010-12-02 DIAGNOSIS — R51 Headache: Secondary | ICD-10-CM

## 2010-12-02 LAB — CHLAMYDIA PROBE AMPLIFICATION, URINE: Chlamydia, Swab/Urine, PCR: NEGATIVE

## 2010-12-08 ENCOUNTER — Ambulatory Visit
Admission: RE | Admit: 2010-12-08 | Discharge: 2010-12-08 | Disposition: A | Discharge status: Home / Self Care | Payer: BC Managed Care – PPO | Source: Ambulatory Visit | Attending: Internal Medicine | Admitting: Internal Medicine

## 2010-12-10 ENCOUNTER — Telehealth: Payer: Self-pay | Admitting: Internal Medicine

## 2010-12-10 NOTE — Telephone Encounter (Signed)
Pt requesting you call him regarding MRI

## 2010-12-22 ENCOUNTER — Encounter: Payer: Self-pay | Admitting: Internal Medicine

## 2010-12-22 ENCOUNTER — Ambulatory Visit (INDEPENDENT_AMBULATORY_CARE_PROVIDER_SITE_OTHER): Payer: BC Managed Care – PPO | Admitting: Internal Medicine

## 2010-12-22 VITALS — BP 114/82 | HR 84 | Temp 98.3°F | Wt 182.0 lb

## 2010-12-22 DIAGNOSIS — Z23 Encounter for immunization: Secondary | ICD-10-CM

## 2010-12-22 DIAGNOSIS — R51 Headache: Secondary | ICD-10-CM

## 2010-12-22 DIAGNOSIS — J45909 Unspecified asthma, uncomplicated: Secondary | ICD-10-CM

## 2010-12-22 MED ORDER — MOMETASONE FURO-FORMOTEROL FUM 100-5 MCG/ACT IN AERO: 2.0000 | INHALATION_SPRAY | Freq: Two times a day (BID) | RESPIRATORY_TRACT | Status: DC

## 2010-12-22 NOTE — Assessment & Plan Note (Signed)
Improved.  MRI of Brain was negative for intracranial lesion.  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

## 2010-12-22 NOTE — Assessment & Plan Note (Addendum)
Well controlled.  Continue maintenance inhaler.  Consider switch to qvar Updated pt with influenza vaccine

## 2010-12-22 NOTE — Patient Instructions (Signed)
Use zyrtec D over the counter as needed

## 2010-12-22 NOTE — Progress Notes (Signed)
  Subjective:    Patient ID: Raymond Torres, male    DOB: 08/14/1965, 45 y.o.   MRN: [id number]  HPI  45 y/o AA male for follow up re:  Severe Headache.  His headache symptoms significantly improved.  MRI of brain reviewed in detail with patient.    He denies symptoms of sinusitis  Asthma - he is using dulera regularly.  He rarely uses rescue inhaler.  Review of Systems Negative for cough or SOB  Past Medical History  Diagnosis Date  . Asthma childhood  . Hyperlipidemia   . 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 Microscopic hematuria     history of  . Helicobacter pylori (H. pylori)     history of  . Allergy     rhinitis    History   Social History  . Marital Status: Married    Spouse Name: N/A    Number of Children: 3  . Years of Education: N/A   Occupational History  . Manager    Social History Main Topics  . Smoking status: Never Smoker   . Smokeless tobacco: Not on file  . Alcohol Use: Yes  . Drug Use: No  . Sexually Active:    Other Topics Concern  . Not on file   Social History Narrative  . No narrative on file    Past Surgical History  Procedure Date  . Achilles tendon repair 08/2009    Dr August Saucer    Family History  Problem Relation Age of Onset  . Hypertension Mother   . Cancer Father     lung and prostate  . Cancer Other     prostate    No Known Allergies  Current Outpatient Prescriptions on File Prior to Visit  Medication Sig Dispense Refill  . albuterol (PROAIR HFA) 108 (90 BASE) MCG/ACT inhaler Inhale 2 puffs into the lungs every 6 (six) hours as needed.        . cetirizine (ZYRTEC) 10 MG tablet Take 10 mg by mouth daily as needed.        . traMADol (ULTRAM) 50 MG tablet Use 2 x daily as needed for headache  30 tablet  0    BP 114/82  Pulse 84  Temp(Src) 98.3 F (36.8 C) (Oral)  Wt 182 lb (82.555 kg)       Objective:   Physical Exam   Constitutional: Appears well-developed and  well-nourished. No distress.  Head: Normocephalic and atraumatic.  Mouth/Throat: Oropharynx is clear and moist.  Eyes: Conjunctivae are normal. Pupils are equal, round, and reactive to light.  Neck: Normal range of motion. Neck supple. No thyromegaly present. No carotid bruit Cardiovascular: Normal rate, regular rhythm and normal heart sounds.  Exam reveals no gallop and no friction rub.  No murmur heard. Pulmonary/Chest: Effort normal and breath sounds normal.  No wheezes. No rales.  Neurological: Alert. No cranial nerve deficit.  Skin: Skin is warm and dry.  Psychiatric: Normal mood and affect. Behavior is normal.        Assessment & Plan:

## 2011-08-18 ENCOUNTER — Other Ambulatory Visit (INDEPENDENT_AMBULATORY_CARE_PROVIDER_SITE_OTHER): Payer: BC Managed Care – PPO

## 2011-08-18 DIAGNOSIS — Z0000 Encounter for general adult medical examination without abnormal findings: Secondary | ICD-10-CM

## 2011-08-18 LAB — CBC WITH DIFFERENTIAL/PLATELET
Basophils Absolute: 0 10*3/uL (ref 0.0–0.1)
HCT: 38.9 % — ABNORMAL LOW (ref 39.0–52.0)
Lymphocytes Relative: 40.6 % (ref 12.0–46.0)
Lymphs Abs: 2.6 10*3/uL (ref 0.7–4.0)
Monocytes Relative: 13 % — ABNORMAL HIGH (ref 3.0–12.0)
Platelets: 302 10*3/uL (ref 150.0–400.0)
RDW: 12.4 % (ref 11.5–14.6)

## 2011-08-18 LAB — HEPATIC FUNCTION PANEL
ALT: 21 U/L (ref 0–53)
Bilirubin, Direct: 0.1 mg/dL (ref 0.0–0.3)
Total Bilirubin: 1 mg/dL (ref 0.3–1.2)
Total Protein: 6.6 g/dL (ref 6.0–8.3)

## 2011-08-18 LAB — BASIC METABOLIC PANEL
BUN: 12 mg/dL (ref 6–23)
Calcium: 8.7 mg/dL (ref 8.4–10.5)
GFR: 119.03 mL/min (ref >60.00)
Glucose, Bld: 71 mg/dL (ref 70–99)

## 2011-08-18 LAB — POCT URINALYSIS DIPSTICK
Bilirubin, UA: NEGATIVE
Ketones, UA: NEGATIVE
Leukocytes, UA: NEGATIVE
Spec Grav, UA: 1.025

## 2011-08-18 LAB — TSH: TSH: 1 u[IU]/mL (ref 0.35–5.50)

## 2011-08-18 LAB — LIPID PANEL
Cholesterol: 146 mg/dL (ref 0–200)
Triglycerides: 102 mg/dL (ref 0.0–149.0)

## 2011-08-25 ENCOUNTER — Ambulatory Visit (INDEPENDENT_AMBULATORY_CARE_PROVIDER_SITE_OTHER): Payer: BC Managed Care – PPO | Admitting: Internal Medicine

## 2011-08-25 ENCOUNTER — Encounter: Payer: Self-pay | Admitting: Internal Medicine

## 2011-08-25 VITALS — BP 104/78 | HR 72 | Temp 98.1°F | Ht 67.0 in | Wt 179.0 lb

## 2011-08-25 DIAGNOSIS — D649 Anemia, unspecified: Secondary | ICD-10-CM

## 2011-08-25 DIAGNOSIS — R3129 Other microscopic hematuria: Secondary | ICD-10-CM | POA: Insufficient documentation

## 2011-08-25 DIAGNOSIS — Z0000 Encounter for general adult medical examination without abnormal findings: Secondary | ICD-10-CM | POA: Insufficient documentation

## 2011-08-25 DIAGNOSIS — R195 Other fecal abnormalities: Secondary | ICD-10-CM

## 2011-08-25 MED ORDER — MOMETASONE FURO-FORMOTEROL FUM 100-5 MCG/ACT IN AERO: 2.0000 | INHALATION_SPRAY | Freq: Two times a day (BID) | RESPIRATORY_TRACT | Status: DC

## 2011-08-25 NOTE — Assessment & Plan Note (Signed)
46 year old [place] American male has mild unexplained normocytic anemia. He has guaiac positive stools. Rule out colonic lesion. Refer to GI for colonoscopy.

## 2011-08-25 NOTE — Assessment & Plan Note (Signed)
Reviewed adult health maintenance protocols.  He has family hx of prostate cancer.  Over the past 3-5 years there has been slight increase in PSA. Current level is 2.0. Digital rectal exam was normal. Repeat screening in one year.  Patient is up-to-date with tetanus vaccine. Lipid panel is acceptable. Blood sugar is normal.

## 2011-08-25 NOTE — Assessment & Plan Note (Signed)
Patient has 1+ blood on urine dip. Arrange full urinalysis with microscopy in one month.

## 2011-08-25 NOTE — Progress Notes (Signed)
Subjective:    Patient ID: Raymond Torres, male    DOB: 02/12/1966, 46 y.o.   MRN: [id number]  HPI  46 year old [place] American male with history of asthma for routine physical. He denies significant interval past medical history. His asthma is well-controlled. He rarely uses his rescue inhaler. He is using his maintenance inhaler as directed.  Patient reports slightly enlarging bump in right occipital area. He reports suffering a fall when he was 46 years old. The last 3 months the bump seems to be getting slightly larger. He had MRI of brain in September of 2012. It was negative for any acute process/abnormality.  Patient denies use of over-the-counter NSAIDs a regular basis. He takes once a month for occasional headache. He denies family history of colon cancer.    Review of Systems   Constitutional: Negative for activity change, appetite change and unexpected weight change.  Eyes: Negative for visual disturbance.  Respiratory: Negative for cough, chest tightness and shortness of breath.   Cardiovascular: Negative for chest pain.  Genitourinary: Negative for difficulty urinating.  negative for hematuria Neurological: Negative for headaches.  Gastrointestinal: Negative for abdominal pain, heartburn melena or hematochezia Psych: Negative for depression or anxiety      Past Medical History  Diagnosis Date  . Asthma childhood  . Hyperlipidemia   . 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 Microscopic hematuria     history of  . Helicobacter pylori (H. pylori)     history of  . Allergy     rhinitis    History   Social History  . Marital Status: Married    Spouse Name: N/A    Number of Children: 3  . Years of Education: N/A   Occupational History  . Manager    Social History Main Topics  . Smoking status: Never Smoker   . Smokeless tobacco: Not on file  . Alcohol Use: Yes  . Drug Use: No  . Sexually Active:    Other Topics Concern    . Not on file   Social History Narrative  . No narrative on file    Past Surgical History  Procedure Date  . Achilles tendon repair 08/2009    Dr August Saucer    Family History  Problem Relation Age of Onset  . Hypertension Mother   . Cancer Father     lung and prostate  . Cancer Other     prostate    No Known Allergies  Current Outpatient Prescriptions on File Prior to Visit  Medication Sig Dispense Refill  . DISCONTD: mometasone-formoterol (DULERA) 100-5 MCG/ACT AERO Inhale 2 puffs into the lungs 2 (two) times daily.  13 g  5    BP 104/78  Pulse 72  Temp(Src) 98.1 F (36.7 C) (Oral)  Ht 5\' 7"  (1.702 m)  Wt 179 lb (81.194 kg)  BMI 28.04 kg/m2       Objective:   Physical Exam  Constitutional: He is oriented to person, place, and time. He appears well-developed and well-nourished. No distress.  HENT:  Right Ear: External ear normal.  Left Ear: External ear normal.  Mouth/Throat: Oropharynx is clear and moist.       Oblong shaped slightly firm area right occipital area, non tender  Eyes: Conjunctivae and EOM are normal. Pupils are equal, round, and reactive to light. No scleral icterus.  Neck: Neck supple.       No carotid bruit  Cardiovascular: Normal rate, regular rhythm  and normal heart sounds.   No murmur heard. Pulmonary/Chest: Effort normal and breath sounds normal. He has no wheezes.  Abdominal: Soft. Bowel sounds are normal. He exhibits no mass. There is no tenderness.  Genitourinary:       No rectal masses, normal size prostate, no asymmetry Heme positive stools  Musculoskeletal: He exhibits no edema.  Lymphadenopathy:    He has no cervical adenopathy.  Neurological: He is alert and oriented to person, place, and time. No cranial nerve deficit. He exhibits normal muscle tone.  Skin: Skin is warm and dry.  Psychiatric: He has a normal mood and affect. His behavior is normal.        Assessment & Plan:

## 2011-08-25 NOTE — Patient Instructions (Signed)
Our office will contact you re: referral to gastroenterologist Please return in one month for urinalysis with microscopy

## 2011-08-30 ENCOUNTER — Encounter: Payer: Self-pay | Admitting: Gastroenterology

## 2011-09-23 ENCOUNTER — Ambulatory Visit (INDEPENDENT_AMBULATORY_CARE_PROVIDER_SITE_OTHER): Payer: BC Managed Care – PPO | Admitting: Gastroenterology

## 2011-09-23 ENCOUNTER — Encounter: Payer: Self-pay | Admitting: Gastroenterology

## 2011-09-23 VITALS — BP 100/70 | HR 68 | Ht 67.0 in | Wt 175.8 lb

## 2011-09-23 DIAGNOSIS — R195 Other fecal abnormalities: Secondary | ICD-10-CM

## 2011-09-23 DIAGNOSIS — D649 Anemia, unspecified: Secondary | ICD-10-CM

## 2011-09-23 MED ORDER — MOVIPREP 100 G PO SOLR: 1.0000 | ORAL | Status: DC

## 2011-09-23 NOTE — Patient Instructions (Addendum)
You will be set up for a colonoscopy for fobt positive stool, mild anemia.

## 2011-09-23 NOTE — Progress Notes (Signed)
   HPI: This is a   very pleasant 46 year old man whom I am meeting for the first time today  He has no overt gi bleeding, no melena.  No diarrhea, constipation.  No fh of colon cancer.  WEight fluctuates.   FAther had prostate cancer, passed from it.   No nose bleeding.    Blood work last month shows a hemoglobin of 12.8, normocytic. He was Hemoccult-positive on physical exam.    Review of systems: Pertinent positive and negative review of systems were noted in the above HPI section. Complete review of systems was performed and was otherwise normal.    Past Medical History  Diagnosis Date  . Asthma childhood  . Hyperlipidemia   . 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 Microscopic hematuria     history of  . Helicobacter pylori (H. pylori)     history of  . Allergy     rhinitis    Past Surgical History  Procedure Date  . Achilles tendon repair 08/2009    Dr August Saucer    Current Outpatient Prescriptions  Medication Sig Dispense Refill  . albuterol (PROVENTIL) (2.5 MG/3ML) 0.083% nebulizer solution Take 2.5 mg by nebulization every 6 (six) hours as needed.      . mometasone-formoterol (DULERA) 100-5 MCG/ACT AERO Inhale 2 puffs into the lungs 2 (two) times daily.  13 g  5    Allergies as of 09/23/2011  . (No Known Allergies)    Family History  Problem Relation Age of Onset  . Hypertension Mother   . Cancer Father     lung and prostate  . Prostate cancer Father   . Cancer Other     prostate  . Colon cancer Neg Hx     History   Social History  . Marital Status: Married    Spouse Name: N/A    Number of Children: 3  . Years of Education: N/A   Occupational History  . Manager    Social History Main Topics  . Smoking status: Never Smoker   . Smokeless tobacco: Never Used  . Alcohol Use: Yes     occ  . Drug Use: No  . Sexually Active: Not on file   Other Topics Concern  . Not on file   Social History Narrative   Works  as Production designer, theatre/television/film at [employer]3 children ages 49 (daugter), 39 (daughter) and 4 (son)       Physical Exam: BP 100/70  Pulse 68  Ht 5\' 7"  (1.702 m)  Wt 175 lb 12.8 oz (79.742 kg)  BMI 27.53 kg/m2 Constitutional: generally well-appearing Psychiatric: alert and oriented x3 Eyes: extraocular movements intact Mouth: oral pharynx moist, no lesions Neck: supple no lymphadenopathy Cardiovascular: heart regular rate and rhythm Lungs: clear to auscultation bilaterally Abdomen: soft, nontender, nondistended, no obvious ascites, no peritoneal signs, normal bowel sounds Extremities: no lower extremity edema bilaterally Skin: no lesions on visible extremities    Assessment and plan: 46 y.o. male with  Hemoccult-positive stool, mild normocytic anemia. No overt bleeding or bowel troubles  We will proceed with colonoscopy at his soonest convenience I see no reason for any further blood tests or imaging studies prior to then.

## 2011-09-26 ENCOUNTER — Encounter: Payer: Self-pay | Admitting: Gastroenterology

## 2011-10-10 ENCOUNTER — Telehealth: Payer: Self-pay | Admitting: Gastroenterology

## 2011-10-10 NOTE — Telephone Encounter (Signed)
yes

## 2011-10-11 ENCOUNTER — Other Ambulatory Visit: Payer: BC Managed Care – PPO | Admitting: Gastroenterology

## 2012-02-06 ENCOUNTER — Emergency Department (HOSPITAL_COMMUNITY)
Admission: EM | Admit: 2012-02-06 | Discharge: 2012-02-06 | Disposition: A | Discharge status: Home / Self Care | Payer: Worker's Compensation | Attending: Emergency Medicine | Admitting: Emergency Medicine

## 2012-02-06 ENCOUNTER — Encounter (HOSPITAL_COMMUNITY): Payer: Self-pay | Admitting: Emergency Medicine

## 2012-02-06 ENCOUNTER — Emergency Department (HOSPITAL_COMMUNITY): Payer: Worker's Compensation

## 2012-02-06 DIAGNOSIS — W010XXA Fall on same level from slipping, tripping and stumbling without subsequent striking against object, initial encounter: Secondary | ICD-10-CM | POA: Insufficient documentation

## 2012-02-06 DIAGNOSIS — Y9229 Other specified public building as the place of occurrence of the external cause: Secondary | ICD-10-CM | POA: Insufficient documentation

## 2012-02-06 DIAGNOSIS — M546 Pain in thoracic spine: Secondary | ICD-10-CM | POA: Insufficient documentation

## 2012-02-06 DIAGNOSIS — J310 Chronic rhinitis: Secondary | ICD-10-CM | POA: Insufficient documentation

## 2012-02-06 DIAGNOSIS — Z79899 Other long term (current) drug therapy: Secondary | ICD-10-CM | POA: Insufficient documentation

## 2012-02-06 DIAGNOSIS — Y939 Activity, unspecified: Secondary | ICD-10-CM | POA: Insufficient documentation

## 2012-02-06 DIAGNOSIS — Z8619 Personal history of other infectious and parasitic diseases: Secondary | ICD-10-CM | POA: Insufficient documentation

## 2012-02-06 DIAGNOSIS — I252 Old myocardial infarction: Secondary | ICD-10-CM | POA: Insufficient documentation

## 2012-02-06 DIAGNOSIS — M549 Dorsalgia, unspecified: Secondary | ICD-10-CM

## 2012-02-06 DIAGNOSIS — Z8679 Personal history of other diseases of the circulatory system: Secondary | ICD-10-CM | POA: Insufficient documentation

## 2012-02-06 DIAGNOSIS — E785 Hyperlipidemia, unspecified: Secondary | ICD-10-CM | POA: Insufficient documentation

## 2012-02-06 DIAGNOSIS — J45909 Unspecified asthma, uncomplicated: Secondary | ICD-10-CM | POA: Insufficient documentation

## 2012-02-06 MED ORDER — HYDROCODONE-ACETAMINOPHEN 5-325 MG PO TABS
1.0000 | ORAL_TABLET | Freq: Once | ORAL | Status: AC
  Administered 2012-02-06: 1 via ORAL
  Filled 2012-02-06: qty 1

## 2012-02-06 MED ORDER — HYDROCODONE-ACETAMINOPHEN 5-500 MG PO TABS: 1.0000 | ORAL_TABLET | Freq: Four times a day (QID) | ORAL | Status: DC | PRN

## 2012-02-06 NOTE — ED Notes (Signed)
Pt presenting to ed with c/o coming out of the cooler at a restaurant and he slipped and fell on his back. Pt states positive upper back pain

## 2012-02-06 NOTE — ED Provider Notes (Addendum)
History     CSN: [id number]  Arrival date & time 02/06/12  0711   First MD Initiated Contact with Patient 02/06/12 0735      Chief Complaint  Patient presents with  . Back Pain    (Consider location/radiation/quality/duration/timing/severity/associated sxs/prior treatment) Patient is a 46 y.o. male presenting with back pain. The history is provided by the patient.  Back Pain  This is a new problem. The current episode started yesterday. The problem occurs constantly. The problem has not changed since onset.The pain is associated with falling. The pain is present in the thoracic spine. The quality of the pain is described as aching. The pain does not radiate. The pain is at a severity of 7/10. The pain is moderate. The symptoms are aggravated by certain positions, bending and twisting. The pain is the same all the time. Stiffness is present in the morning. Pertinent negatives include no chest pain, no fever, no numbness, no abdominal pain, no bowel incontinence, no dysuria, no pelvic pain, no leg pain, no paresis, no tingling and no weakness. He has tried NSAIDs for the symptoms. The treatment provided moderate relief.    Past Medical History  Diagnosis Date  . Asthma childhood  . Hyperlipidemia   . 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 Microscopic hematuria     history of  . Helicobacter pylori (H. pylori)     history of  . Allergy     rhinitis    Past Surgical History  Procedure Date  . Achilles tendon repair 08/2009    Dr August Saucer    Family History  Problem Relation Age of Onset  . Hypertension Mother   . Cancer Father     lung and prostate  . Prostate cancer Father   . Cancer Other     prostate  . Colon cancer Neg Hx     History  Substance Use Topics  . Smoking status: Never Smoker   . Smokeless tobacco: Never Used  . Alcohol Use: Yes     Comment: occ      Review of Systems  Constitutional: Negative for fever.    Respiratory: Negative for cough and shortness of breath.   Cardiovascular: Negative for chest pain.  Gastrointestinal: Negative for nausea, vomiting, abdominal pain, diarrhea and bowel incontinence.  Genitourinary: Negative for dysuria and pelvic pain.  Musculoskeletal: Positive for back pain.  Neurological: Negative for tingling, weakness and numbness.  All other systems reviewed and are negative.    Allergies  Review of patient's allergies indicates no known allergies.  Home Medications   Current Outpatient Rx  Name  Route  Sig  Dispense  Refill  . ALBUTEROL SULFATE (2.5 MG/3ML) 0.083% IN NEBU   Nebulization   Take 2.5 mg by nebulization every 6 (six) hours as needed.         . MOMETASONE FURO-FORMOTEROL FUM 100-5 MCG/ACT IN AERO   Inhalation   Inhale 2 puffs into the lungs 2 (two) times daily.   13 g   5   . MOVIPREP 100 G PO SOLR   Oral   Take 1 kit (100 g total) by mouth as directed. Name brand only   1 kit   0     Dispense as written.     BP 103/74  Pulse 70  Temp 98.1 F (36.7 C) (Oral)  Resp 20  SpO2 95%  Physical Exam  Nursing note and vitals reviewed. Constitutional: He is oriented to  person, place, and time. He appears well-developed and well-nourished. No distress.  HENT:  Head: Normocephalic and atraumatic.  Mouth/Throat: Oropharynx is clear and moist.  Eyes: Conjunctivae normal and EOM are normal. Pupils are equal, round, and reactive to light.  Neck: Normal range of motion. Neck supple.  Cardiovascular: Normal rate, regular rhythm and intact distal pulses.   No murmur heard. Pulmonary/Chest: Effort normal and breath sounds normal. No respiratory distress. He has no wheezes. He has no rales. He exhibits no tenderness.  Abdominal: Soft. He exhibits no distension. There is no tenderness. There is no rebound and no guarding.  Musculoskeletal: Normal range of motion. He exhibits no edema and no tenderness.       Thoracic back: He exhibits  tenderness and bony tenderness. He exhibits normal range of motion and no pain.  Neurological: He is alert and oriented to person, place, and time.  Skin: Skin is warm and dry. No rash noted. No erythema.  Psychiatric: He has a normal mood and affect. His behavior is normal.    ED Course  Procedures (including critical care time)  Labs Reviewed - No data to display Dg Thoracic Spine W/swimmers  02/06/2012  *RADIOLOGY REPORT*  Clinical Data: Fall, upper back pain  THORACIC SPINE - 2 VIEW + SWIMMERS  Comparison: 09/15/2005, 03/21/2004  Findings: Slight curvature of the spine may be positional.  Lower thoracic spondylosis and degenerative changes at T10-11 and T11-12. Normal alignment.  No compression fracture or wedge-shaped deformity.  Vertebral body heights maintained.  No focal kyphosis.  IMPRESSION: Lower thoracic degenerative changes and spondylosis.  No acute finding.   Original Report Authenticated By: Judie Petit. Shick, M.D.      1. Back pain       MDM   Pt with mechanical fall yesterday and still having thoracic pain.  NV intact.  No chest or abd pain.  No head injury or LOC.  Thoracic films pending.  Pt given pain control.  9:02 AM Films without acute findingts.  Pt d/ced home.     Gwyneth Sprout, MD 02/06/12 1610  Gwyneth Sprout, MD 02/06/12 [phone]

## 2012-02-06 NOTE — ED Notes (Signed)
Pt states having upper back pain that started last night, pain 7/10, pt moving w/o difficulty, pain medication given. Denies injury.

## 2012-06-22 ENCOUNTER — Encounter: Payer: Self-pay | Admitting: Internal Medicine

## 2012-06-22 ENCOUNTER — Ambulatory Visit (INDEPENDENT_AMBULATORY_CARE_PROVIDER_SITE_OTHER): Payer: BC Managed Care – PPO | Admitting: Internal Medicine

## 2012-06-22 VITALS — BP 104/80 | HR 64 | Temp 98.1°F | Wt 169.0 lb

## 2012-06-22 DIAGNOSIS — D649 Anemia, unspecified: Secondary | ICD-10-CM

## 2012-06-22 DIAGNOSIS — J309 Allergic rhinitis, unspecified: Secondary | ICD-10-CM

## 2012-06-22 DIAGNOSIS — K644 Residual hemorrhoidal skin tags: Secondary | ICD-10-CM

## 2012-06-22 DIAGNOSIS — K625 Hemorrhage of anus and rectum: Secondary | ICD-10-CM

## 2012-06-22 MED ORDER — MOMETASONE FUROATE 50 MCG/ACT NA SUSP: 2.0000 | Freq: Every day | NASAL | Status: DC

## 2012-06-22 NOTE — Assessment & Plan Note (Signed)
Patient referred to gastroenterologist for colonoscopy. He deferred due to financial reasons.  Refer to [facility] center for in office colonoscopy.

## 2012-06-22 NOTE — Assessment & Plan Note (Signed)
Patient experiencing exacerbation of allergic rhinitis. He was advised to use over-the-counter Zyrtec 10 mg and Nasonex as directed.

## 2012-06-22 NOTE — Patient Instructions (Signed)
Take over the counter Zyrtec 10 mg once daily Our office will contact you re: referral for colonoscopy. Use Metamucil once to twice daily as directed

## 2012-06-22 NOTE — Progress Notes (Signed)
  Subjective:    Patient ID: Raymond Torres, male    DOB: 10/16/1965, 47 y.o.   MRN: [id number]  HPI  47 year old African American male with history of mild microcytic anemia and rectal bleeding for followup. Patient previously referred to gastroenterologist for colonoscopy.  Patient had to defer procedure for financial reasons. Over last several weeks patient complains of symptomatic external hemorrhoids. He reports intermittent bleeding.  There's been improvement with using over-the-counter Preparation H.  He also complains of solid symptoms. He has nasal congestion and postnasal drip. Denies fever or chills.  Review of Systems See HPI    Past Medical History  Diagnosis Date  . Asthma childhood  . Hyperlipidemia   . 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 Microscopic hematuria     history of  . Helicobacter pylori (H. pylori)     history of  . Allergy     rhinitis    History   Social History  . Marital Status: Married    Spouse Name: N/A    Number of Children: 3  . Years of Education: N/A   Occupational History  . Manager    Social History Main Topics  . Smoking status: Never Smoker   . Smokeless tobacco: Never Used  . Alcohol Use: Yes     Comment: occ  . Drug Use: No  . Sexually Active: Not on file   Other Topics Concern  . Not on file   Social History Narrative   Works as Production designer, theatre/television/film at [employer]   3 children ages 24 (daugter), 36 (daughter) and 4 (son)    Past Surgical History  Procedure Laterality Date  . Achilles tendon repair  08/2009    Dr August Saucer    Family History  Problem Relation Age of Onset  . Hypertension Mother   . Cancer Father     lung and prostate  . Prostate cancer Father   . Cancer Other     prostate  . Colon cancer Neg Hx     Allergies  Allergen Reactions  . Shellfish Allergy     Current Outpatient Prescriptions on File Prior to Visit  Medication Sig Dispense Refill  . albuterol  (PROVENTIL HFA;VENTOLIN HFA) 108 (90 BASE) MCG/ACT inhaler Inhale 2 puffs into the lungs every 6 (six) hours as needed. wheezing      . mometasone-formoterol (DULERA) 100-5 MCG/ACT AERO Inhale 2 puffs into the lungs 2 (two) times daily.  13 g  5   No current facility-administered medications on file prior to visit.    BP 104/80  Pulse 64  Temp(Src) 98.1 F (36.7 C) (Oral)  Wt 169 lb (76.658 kg)  BMI 26.46 kg/m2    Objective:   Physical Exam  Constitutional: He appears well-developed and well-nourished.  Cardiovascular: Normal rate, regular rhythm and normal heart sounds.   Pulmonary/Chest: Effort normal and breath sounds normal. He has no wheezes.  Genitourinary: Prostate normal. Guaiac negative stool.  External hemorrhoids, no rectal mass          Assessment & Plan:

## 2012-06-22 NOTE — Assessment & Plan Note (Signed)
Patient has external hemorrhoids. His symptoms improved with use of over-the-counter Preparation H. Patient advised to use psyllium fiber laxative daily.

## 2012-06-25 ENCOUNTER — Encounter: Payer: Self-pay | Admitting: Internal Medicine

## 2012-08-20 ENCOUNTER — Other Ambulatory Visit (INDEPENDENT_AMBULATORY_CARE_PROVIDER_SITE_OTHER): Payer: BC Managed Care – PPO

## 2012-08-20 DIAGNOSIS — Z0000 Encounter for general adult medical examination without abnormal findings: Secondary | ICD-10-CM

## 2012-08-20 LAB — POCT URINALYSIS DIPSTICK
Bilirubin, UA: NEGATIVE
Glucose, UA: NEGATIVE
Ketones, UA: NEGATIVE
Spec Grav, UA: 1.02
Urobilinogen, UA: 0.2

## 2012-08-20 LAB — LIPID PANEL
Cholesterol: 158 mg/dL (ref 0–200)
LDL Cholesterol: 105 mg/dL — ABNORMAL HIGH (ref 0–99)
Triglycerides: 78 mg/dL (ref 0.0–149.0)

## 2012-08-20 LAB — CBC WITH DIFFERENTIAL/PLATELET
Basophils Absolute: 0 10*3/uL (ref 0.0–0.1)
Lymphocytes Relative: 31.6 % (ref 12.0–46.0)
Monocytes Relative: 10.5 % (ref 3.0–12.0)
Neutrophils Relative %: 52.2 % (ref 43.0–77.0)
Platelets: 271 10*3/uL (ref 150.0–400.0)
RDW: 12.9 % (ref 11.5–14.6)
WBC: 6.8 10*3/uL (ref 4.5–10.5)

## 2012-08-20 LAB — BASIC METABOLIC PANEL
BUN: 11 mg/dL (ref 6–23)
Calcium: 9.1 mg/dL (ref 8.4–10.5)
GFR: 121.71 mL/min (ref >60.00)
Glucose, Bld: 88 mg/dL (ref 70–99)

## 2012-08-20 LAB — HEPATIC FUNCTION PANEL
AST: 22 U/L (ref 0–37)
Alkaline Phosphatase: 68 U/L (ref 39–117)
Bilirubin, Direct: 0.2 mg/dL (ref 0.0–0.3)
Total Bilirubin: 1.2 mg/dL (ref 0.3–1.2)

## 2012-08-20 LAB — TSH: TSH: 1.37 u[IU]/mL (ref 0.35–5.50)

## 2012-08-27 ENCOUNTER — Encounter: Payer: Self-pay | Admitting: Internal Medicine

## 2012-08-27 ENCOUNTER — Ambulatory Visit (INDEPENDENT_AMBULATORY_CARE_PROVIDER_SITE_OTHER): Payer: BC Managed Care – PPO | Admitting: Internal Medicine

## 2012-08-27 VITALS — BP 112/82 | HR 64 | Temp 98.2°F | Ht 67.0 in | Wt 171.0 lb

## 2012-08-27 DIAGNOSIS — Z0000 Encounter for general adult medical examination without abnormal findings: Secondary | ICD-10-CM

## 2012-08-27 DIAGNOSIS — D649 Anemia, unspecified: Secondary | ICD-10-CM

## 2012-08-27 DIAGNOSIS — J45909 Unspecified asthma, uncomplicated: Secondary | ICD-10-CM

## 2012-08-27 MED ORDER — BECLOMETHASONE DIPROPIONATE 80 MCG/ACT IN AERS: 2.0000 | INHALATION_SPRAY | Freq: Two times a day (BID) | RESPIRATORY_TRACT | Status: DC

## 2012-08-27 NOTE — Assessment & Plan Note (Signed)
Patient referred to [facility] for further workup. He reports EGD showed ulcers. He is taking proton pump inhibitor regularly. His colonoscopy was unremarkable.

## 2012-08-27 NOTE — Progress Notes (Signed)
Subjective:    Patient ID: Raymond Torres, male    DOB: [birth year], 47 y.o.   MRN: [id number]  HPI  47 year old African American male with history of mild microcytic anemia and asthma for routine physical. At previous visit he was referred to gastroenterologist in [facility] for further evaluation. He reports completing EGD and colonoscopy. EGD showed ulcers. He is taking PPI regularly. He reports colonoscopy was unremarkable. Gastroenterologist at recommended repeat colonoscopy in 10 years.  No other significant interval medical history.  His asthma is well-controlled. He uses his rescue inhaler twice every 2-3 months.  Review of Systems   Constitutional: Negative for activity change, appetite change and unexpected weight change.  Eyes: Negative for visual disturbance.  Respiratory: Negative for cough, chest tightness and shortness of breath.   Cardiovascular: Negative for chest pain.  Genitourinary: Negative for difficulty urinating.  Neurological: Negative for headaches.  Gastrointestinal: Negative for abdominal pain, heartburn melena or hematochezia Psych: Negative for depression or anxiety Endo:  No polyuria or polydypsia, no sexual dysfunction  Past Medical History  Diagnosis Date  . Asthma childhood  . Hyperlipidemia   . 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 Microscopic hematuria     history of  . Helicobacter pylori (H. pylori)     history of  . Allergy     rhinitis    History   Social History  . Marital Status: Married    Spouse Name: N/A    Number of Children: 3  . Years of Education: N/A   Occupational History  . Manager    Social History Main Topics  . Smoking status: Never Smoker   . Smokeless tobacco: Never Used  . Alcohol Use: Yes     Comment: occ  . Drug Use: No  . Sexually Active: Not on file   Other Topics Concern  . Not on file   Social History Narrative   Works as Production designer, theatre/television/film at [employer]    3 children ages 74 (daugter), 61 (daughter) and 4 (son)    Past Surgical History  Procedure Laterality Date  . Achilles tendon repair  08/2009    Dr August Saucer    Family History  Problem Relation Age of Onset  . Hypertension Mother   . Cancer Father     lung and prostate  . Prostate cancer Father   . Cancer Other     prostate  . Colon cancer Neg Hx     Allergies  Allergen Reactions  . Shellfish Allergy     Current Outpatient Prescriptions on File Prior to Visit  Medication Sig Dispense Refill  . albuterol (PROVENTIL HFA;VENTOLIN HFA) 108 (90 BASE) MCG/ACT inhaler Inhale 2 puffs into the lungs every 6 (six) hours as needed. wheezing      . mometasone (NASONEX) 50 MCG/ACT nasal spray Place 2 sprays into the nose daily.  17 g  5   No current facility-administered medications on file prior to visit.    BP 112/82  Pulse 64  Temp(Src) 98.2 F (36.8 C) (Oral)  Ht 5\' 7"  (1.702 m)  Wt 171 lb (77.565 kg)  BMI 26.78 kg/m2       Objective:   Physical Exam  Constitutional: He is oriented to person, place, and time. He appears well-developed and well-nourished.  HENT:  Head: Normocephalic and atraumatic.  Right Ear: External ear normal.  Left Ear: External ear normal.  Mouth/Throat: Oropharynx is clear and moist.  Eyes:  EOM are normal. Pupils are equal, round, and reactive to light.  Neck: Neck supple.  No carotid bruit  Cardiovascular: Normal rate, regular rhythm and normal heart sounds.   No murmur heard. Pulmonary/Chest: Effort normal and breath sounds normal. He has no wheezes.  Abdominal: Soft. Bowel sounds are normal. There is no tenderness.  Genitourinary: Rectum normal and prostate normal. Guaiac negative stool.  Musculoskeletal: Normal range of motion. He exhibits no edema.  Lymphadenopathy:    He has no cervical adenopathy.  Neurological: He is alert and oriented to person, place, and time. No cranial nerve deficit.  Skin: Skin is warm and dry.  Psychiatric:  He has a normal mood and affect. His behavior is normal.          Assessment & Plan:

## 2012-08-27 NOTE — Assessment & Plan Note (Signed)
Reviewed adult health maintenance protocols.  Patient has family history of prostate cancer. Digital rectal exam and PSA are normal. His lipid panel is within acceptable limits. I encouraged mild weight loss.

## 2012-08-27 NOTE — Patient Instructions (Addendum)
Please complete the following lab tests before your next follow up appointment: CPX labs including PSA 

## 2012-08-27 NOTE — Assessment & Plan Note (Signed)
Patient has well-controlled asthma. Discontinue Dulera. Switch to Qvar.

## 2013-01-21 ENCOUNTER — Other Ambulatory Visit: Payer: Self-pay | Admitting: Internal Medicine

## 2013-01-24 ENCOUNTER — Other Ambulatory Visit: Payer: Self-pay

## 2013-01-24 ENCOUNTER — Telehealth: Payer: Self-pay | Admitting: Internal Medicine

## 2013-01-24 NOTE — Telephone Encounter (Signed)
Samples are ready for pick up.  

## 2013-01-24 NOTE — Telephone Encounter (Signed)
Pt would like a sample of dulera

## 2013-04-09 ENCOUNTER — Encounter (HOSPITAL_COMMUNITY): Payer: Self-pay | Admitting: Emergency Medicine

## 2013-04-09 ENCOUNTER — Emergency Department (HOSPITAL_COMMUNITY): Payer: No Typology Code available for payment source

## 2013-04-09 ENCOUNTER — Emergency Department (HOSPITAL_COMMUNITY)
Admission: EM | Admit: 2013-04-09 | Discharge: 2013-04-09 | Disposition: A | Discharge status: Home / Self Care | Payer: No Typology Code available for payment source | Attending: Emergency Medicine | Admitting: Emergency Medicine

## 2013-04-09 DIAGNOSIS — R5383 Other fatigue: Secondary | ICD-10-CM

## 2013-04-09 DIAGNOSIS — Z8639 Personal history of other endocrine, nutritional and metabolic disease: Secondary | ICD-10-CM | POA: Insufficient documentation

## 2013-04-09 DIAGNOSIS — Z87448 Personal history of other diseases of urinary system: Secondary | ICD-10-CM | POA: Insufficient documentation

## 2013-04-09 DIAGNOSIS — Z862 Personal history of diseases of the blood and blood-forming organs and certain disorders involving the immune mechanism: Secondary | ICD-10-CM | POA: Insufficient documentation

## 2013-04-09 DIAGNOSIS — S199XXA Unspecified injury of neck, initial encounter: Secondary | ICD-10-CM

## 2013-04-09 DIAGNOSIS — Y92410 Unspecified street and highway as the place of occurrence of the external cause: Secondary | ICD-10-CM | POA: Insufficient documentation

## 2013-04-09 DIAGNOSIS — J45909 Unspecified asthma, uncomplicated: Secondary | ICD-10-CM | POA: Insufficient documentation

## 2013-04-09 DIAGNOSIS — R5381 Other malaise: Secondary | ICD-10-CM | POA: Insufficient documentation

## 2013-04-09 DIAGNOSIS — Z9109 Other allergy status, other than to drugs and biological substances: Secondary | ICD-10-CM | POA: Insufficient documentation

## 2013-04-09 DIAGNOSIS — Z79899 Other long term (current) drug therapy: Secondary | ICD-10-CM | POA: Insufficient documentation

## 2013-04-09 DIAGNOSIS — I252 Old myocardial infarction: Secondary | ICD-10-CM | POA: Insufficient documentation

## 2013-04-09 DIAGNOSIS — R519 Headache, unspecified: Secondary | ICD-10-CM

## 2013-04-09 DIAGNOSIS — R51 Headache: Secondary | ICD-10-CM

## 2013-04-09 DIAGNOSIS — IMO0002 Reserved for concepts with insufficient information to code with codable children: Secondary | ICD-10-CM | POA: Insufficient documentation

## 2013-04-09 DIAGNOSIS — Z8619 Personal history of other infectious and parasitic diseases: Secondary | ICD-10-CM | POA: Insufficient documentation

## 2013-04-09 DIAGNOSIS — S060X9A Concussion with loss of consciousness of unspecified duration, initial encounter: Secondary | ICD-10-CM | POA: Insufficient documentation

## 2013-04-09 DIAGNOSIS — Y9389 Activity, other specified: Secondary | ICD-10-CM | POA: Insufficient documentation

## 2013-04-09 DIAGNOSIS — S0993XA Unspecified injury of face, initial encounter: Secondary | ICD-10-CM | POA: Insufficient documentation

## 2013-04-09 MED ORDER — HYDROCODONE-ACETAMINOPHEN 5-325 MG PO TABS
1.0000 | ORAL_TABLET | Freq: Once | ORAL | Status: AC
  Administered 2013-04-09: 1 via ORAL
  Filled 2013-04-09: qty 1

## 2013-04-09 MED ORDER — KETOROLAC TROMETHAMINE 60 MG/2ML IM SOLN
60.0000 mg | Freq: Once | INTRAMUSCULAR | Status: AC
  Administered 2013-04-09: 60 mg via INTRAMUSCULAR
  Filled 2013-04-09: qty 2

## 2013-04-09 MED ORDER — IBUPROFEN 800 MG PO TABS: 800.0000 mg | ORAL_TABLET | Freq: Three times a day (TID) | ORAL | Status: DC

## 2013-04-09 MED ORDER — HYDROCODONE-ACETAMINOPHEN 5-325 MG PO TABS: 1.0000 | ORAL_TABLET | Freq: Four times a day (QID) | ORAL | Status: DC | PRN

## 2013-04-09 MED ORDER — CYCLOBENZAPRINE HCL 10 MG PO TABS: 10.0000 mg | ORAL_TABLET | Freq: Two times a day (BID) | ORAL | Status: DC | PRN
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
## 2013-04-09 NOTE — ED Notes (Signed)
Pt called from waiting room, not answering. Nurse first [employer] notified.
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
## 2013-04-14 NOTE — ED Provider Notes (Signed)
Medical screening examination/treatment/procedure(s) were performed by non-physician practitioner and as supervising physician I was immediately available for consultation/collaboration.  EKG Interpretation   None        Mervin Kung, MD 04/14/13 [phone]

## 2013-04-18 ENCOUNTER — Ambulatory Visit: Payer: Self-pay | Admitting: Family

## 2013-04-26 ENCOUNTER — Ambulatory Visit: Payer: Self-pay | Admitting: Family

## 2013-08-02 ENCOUNTER — Telehealth: Payer: Self-pay | Admitting: Internal Medicine

## 2013-08-02 MED ORDER — ALBUTEROL SULFATE HFA 108 (90 BASE) MCG/ACT IN AERS: 2.0000 | INHALATION_SPRAY | Freq: Four times a day (QID) | RESPIRATORY_TRACT | Status: DC | PRN

## 2013-08-02 NOTE — Telephone Encounter (Signed)
Pt would like a refill of albuterol (PROVENTIL HFA;VENTOLIN HFA) 108 (90 BASE) MCG/ACT inhaler Pt would like samples [facility] [address] Pt has made cpe for 7/31 which was first available

## 2013-08-02 NOTE — Telephone Encounter (Signed)
rx sent in electronically 

## 2013-10-10 ENCOUNTER — Other Ambulatory Visit: Payer: Managed Care, Other (non HMO)

## 2013-10-11 ENCOUNTER — Other Ambulatory Visit (INDEPENDENT_AMBULATORY_CARE_PROVIDER_SITE_OTHER): Payer: Managed Care, Other (non HMO)

## 2013-10-11 DIAGNOSIS — Z0000 Encounter for general adult medical examination without abnormal findings: Secondary | ICD-10-CM

## 2013-10-11 LAB — POCT URINALYSIS DIPSTICK
Bilirubin, UA: NEGATIVE
Glucose, UA: NEGATIVE
KETONES UA: NEGATIVE
Leukocytes, UA: NEGATIVE
Nitrite, UA: NEGATIVE
PH UA: 6.5
SPEC GRAV UA: 1.025
UROBILINOGEN UA: 2

## 2013-10-11 LAB — HEPATIC FUNCTION PANEL
ALBUMIN: 3.9 g/dL (ref 3.5–5.2)
ALK PHOS: 75 U/L (ref 39–117)
ALT: 17 U/L (ref 0–53)
AST: 20 U/L (ref 0–37)
Bilirubin, Direct: 0.2 mg/dL (ref 0.0–0.3)
TOTAL PROTEIN: 6.7 g/dL (ref 6.0–8.3)
Total Bilirubin: 1.5 mg/dL — ABNORMAL HIGH (ref 0.2–1.2)

## 2013-10-11 LAB — LIPID PANEL
CHOL/HDL RATIO: 4
CHOLESTEROL: 155 mg/dL (ref 0–200)
HDL: 35.8 mg/dL — ABNORMAL LOW (ref >39.00)
LDL CALC: 103 mg/dL — AB (ref 0–99)
NonHDL: 119.2
Triglycerides: 81 mg/dL (ref 0.0–149.0)
VLDL: 16.2 mg/dL (ref 0.0–40.0)

## 2013-10-11 LAB — BASIC METABOLIC PANEL
BUN: 10 mg/dL (ref 6–23)
CALCIUM: 9 mg/dL (ref 8.4–10.5)
CO2: 28 meq/L (ref 19–32)
Chloride: 108 mEq/L (ref 96–112)
Creatinine, Ser: 0.9 mg/dL (ref 0.4–1.5)
GFR: 114.95 mL/min (ref >60.00)
GLUCOSE: 89 mg/dL (ref 70–99)
POTASSIUM: 4 meq/L (ref 3.5–5.1)
SODIUM: 138 meq/L (ref 135–145)

## 2013-10-11 LAB — CBC WITH DIFFERENTIAL/PLATELET
BASOS PCT: 0.5 % (ref 0.0–3.0)
Basophils Absolute: 0 10*3/uL (ref 0.0–0.1)
Eosinophils Absolute: 0.3 10*3/uL (ref 0.0–0.7)
Eosinophils Relative: 5 % (ref 0.0–5.0)
HEMATOCRIT: 40.3 % (ref 39.0–52.0)
HEMOGLOBIN: 13.4 g/dL (ref 13.0–17.0)
LYMPHS ABS: 2.2 10*3/uL (ref 0.7–4.0)
LYMPHS PCT: 37.5 % (ref 12.0–46.0)
MCHC: 33.3 g/dL (ref 30.0–36.0)
MCV: 89.5 fl (ref 78.0–100.0)
MONOS PCT: 9.8 % (ref 3.0–12.0)
Monocytes Absolute: 0.6 10*3/uL (ref 0.1–1.0)
NEUTROS ABS: 2.8 10*3/uL (ref 1.4–7.7)
Neutrophils Relative %: 47.2 % (ref 43.0–77.0)
Platelets: 281 10*3/uL (ref 150.0–400.0)
RBC: 4.51 Mil/uL (ref 4.22–5.81)
RDW: 12.4 % (ref 11.5–15.5)
WBC: 5.8 10*3/uL (ref 4.0–10.5)

## 2013-10-11 LAB — TSH: TSH: 0.99 u[IU]/mL (ref 0.35–4.50)

## 2013-10-11 LAB — PSA: PSA: 1.53 ng/mL (ref 0.10–4.00)

## 2013-10-18 ENCOUNTER — Ambulatory Visit (INDEPENDENT_AMBULATORY_CARE_PROVIDER_SITE_OTHER): Payer: Managed Care, Other (non HMO) | Admitting: Internal Medicine

## 2013-10-18 ENCOUNTER — Encounter: Payer: Self-pay | Admitting: Internal Medicine

## 2013-10-18 VITALS — BP 102/74 | HR 84 | Temp 97.8°F | Ht 66.5 in | Wt 175.0 lb

## 2013-10-18 DIAGNOSIS — Z23 Encounter for immunization: Secondary | ICD-10-CM

## 2013-10-18 DIAGNOSIS — J45909 Unspecified asthma, uncomplicated: Secondary | ICD-10-CM

## 2013-10-18 DIAGNOSIS — Z0000 Encounter for general adult medical examination without abnormal findings: Secondary | ICD-10-CM

## 2013-10-18 MED ORDER — ALBUTEROL SULFATE HFA 108 (90 BASE) MCG/ACT IN AERS: 2.0000 | INHALATION_SPRAY | Freq: Four times a day (QID) | RESPIRATORY_TRACT | Status: DC | PRN

## 2013-10-18 MED ORDER — MOMETASONE FURO-FORMOTEROL FUM 100-5 MCG/ACT IN AERO: 2.0000 | INHALATION_SPRAY | Freq: Every day | RESPIRATORY_TRACT | Status: DC

## 2013-10-18 NOTE — Assessment & Plan Note (Signed)
Reviewed adult health maintenance protocols.  DRE normal and PSA stable.  He has family hx of prostate cancer.  Screen yearly.  Refer for screening colonoscopy next year.  Patient encouraged to get flu vaccine when it becomes available.

## 2013-10-18 NOTE — Progress Notes (Signed)
Pre visit review using our clinic review tool, if applicable. No additional management support is needed unless otherwise documented below in the visit note. 
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
## 2013-10-18 NOTE — Assessment & Plan Note (Signed)
He symptoms well controlled.  Dulera and albuterol inhaler refilled.  Consider switching to Qvar.

## 2013-11-11 ENCOUNTER — Other Ambulatory Visit: Payer: Self-pay | Admitting: *Deleted

## 2013-11-11 MED ORDER — BECLOMETHASONE DIPROPIONATE 40 MCG/ACT IN AERS: 2.0000 | INHALATION_SPRAY | Freq: Two times a day (BID) | RESPIRATORY_TRACT | Status: DC

## 2014-01-01 ENCOUNTER — Telehealth: Payer: Self-pay | Admitting: Internal Medicine

## 2014-01-01 NOTE — Telephone Encounter (Signed)
Pt would like sample of qvar or dulera. Pt can not afford med. [facility] [address]

## 2014-01-02 NOTE — Telephone Encounter (Signed)
No samples available.  We have coupons for dulera.  They are up front for pt to p/u, pt aware

## 2014-11-10 ENCOUNTER — Telehealth: Payer: Self-pay | Admitting: Internal Medicine

## 2014-11-10 MED ORDER — MOMETASONE FURO-FORMOTEROL FUM 100-5 MCG/ACT IN AERO: 2.0000 | INHALATION_SPRAY | Freq: Two times a day (BID) | RESPIRATORY_TRACT | Status: DC

## 2014-11-10 NOTE — Telephone Encounter (Signed)
Pt request refill of the following:   DULERA  Pt is scheduled for a physical in Sept    Phamacy: [place]

## 2014-11-19 ENCOUNTER — Telehealth: Payer: Self-pay | Admitting: Internal Medicine

## 2014-11-19 MED ORDER — MOMETASONE FURO-FORMOTEROL FUM 100-5 MCG/ACT IN AERO: 2.0000 | INHALATION_SPRAY | Freq: Two times a day (BID) | RESPIRATORY_TRACT | Status: DC

## 2014-11-19 NOTE — Telephone Encounter (Signed)
Pt request refill of the following: mometasone-formoterol (DULERA) 100-5 MCG/ACT AERO  [facility] to [facility]    Phamacy:  [place]

## 2014-11-21 ENCOUNTER — Telehealth: Payer: Self-pay | Admitting: Internal Medicine

## 2014-11-21 NOTE — Telephone Encounter (Signed)
Can you see if Advair is any less expensive.  If no significant difference, see if Qvar is covered.

## 2014-11-21 NOTE — Telephone Encounter (Signed)
Pt call to ask if there is something else he can take. The following med with his insurance that he has now is much higher mometasone-formoterol (DULERA) 100-5 MCG/ACT AERO

## 2014-11-21 NOTE — Telephone Encounter (Signed)
Spoke with patient and he will call back with a medication covered by insurance

## 2014-11-28 ENCOUNTER — Other Ambulatory Visit (INDEPENDENT_AMBULATORY_CARE_PROVIDER_SITE_OTHER): Payer: Self-pay

## 2014-11-28 DIAGNOSIS — Z0000 Encounter for general adult medical examination without abnormal findings: Secondary | ICD-10-CM

## 2014-11-28 LAB — PSA: PSA: 1.84 ng/mL (ref 0.10–4.00)

## 2014-11-28 LAB — CBC WITH DIFFERENTIAL/PLATELET
BASOS PCT: 1 % (ref 0.0–3.0)
Basophils Absolute: 0 10*3/uL (ref 0.0–0.1)
EOS PCT: 4.4 % (ref 0.0–5.0)
Eosinophils Absolute: 0.2 10*3/uL (ref 0.0–0.7)
HCT: 40.3 % (ref 39.0–52.0)
HEMOGLOBIN: 13.4 g/dL (ref 13.0–17.0)
Lymphocytes Relative: 43.4 % (ref 12.0–46.0)
Lymphs Abs: 2 10*3/uL (ref 0.7–4.0)
MCHC: 33.3 g/dL (ref 30.0–36.0)
MCV: 88.2 fl (ref 78.0–100.0)
MONO ABS: 0.7 10*3/uL (ref 0.1–1.0)
MONOS PCT: 15.8 % — AB (ref 3.0–12.0)
Neutro Abs: 1.6 10*3/uL (ref 1.4–7.7)
Neutrophils Relative %: 35.4 % — ABNORMAL LOW (ref 43.0–77.0)
Platelets: 344 10*3/uL (ref 150.0–400.0)
RBC: 4.57 Mil/uL (ref 4.22–5.81)
RDW: 12.6 % (ref 11.5–15.5)
WBC: 4.5 10*3/uL (ref 4.0–10.5)

## 2014-11-28 LAB — POCT URINALYSIS DIPSTICK
Bilirubin, UA: NEGATIVE
GLUCOSE UA: NEGATIVE
Ketones, UA: NEGATIVE
Leukocytes, UA: NEGATIVE
NITRITE UA: NEGATIVE
PROTEIN UA: NEGATIVE
Spec Grav, UA: 1.025
UROBILINOGEN UA: 0.2
pH, UA: 6

## 2014-11-28 LAB — HEPATIC FUNCTION PANEL
ALBUMIN: 3.9 g/dL (ref 3.5–5.2)
ALT: 17 U/L (ref 0–53)
AST: 17 U/L (ref 0–37)
Alkaline Phosphatase: 86 U/L (ref 39–117)
BILIRUBIN TOTAL: 0.8 mg/dL (ref 0.2–1.2)
Bilirubin, Direct: 0.2 mg/dL (ref 0.0–0.3)
TOTAL PROTEIN: 6.6 g/dL (ref 6.0–8.3)

## 2014-11-28 LAB — BASIC METABOLIC PANEL
BUN: 11 mg/dL (ref 6–23)
CHLORIDE: 106 meq/L (ref 96–112)
CO2: 25 mEq/L (ref 19–32)
Calcium: 8.9 mg/dL (ref 8.4–10.5)
Creatinine, Ser: 0.92 mg/dL (ref 0.40–1.50)
GFR: 111.56 mL/min (ref >60.00)
GLUCOSE: 88 mg/dL (ref 70–99)
POTASSIUM: 4.1 meq/L (ref 3.5–5.1)
SODIUM: 140 meq/L (ref 135–145)

## 2014-11-28 LAB — LIPID PANEL
CHOLESTEROL: 156 mg/dL (ref 0–200)
HDL: 35.7 mg/dL — ABNORMAL LOW (ref >39.00)
LDL CALC: 103 mg/dL — AB (ref 0–99)
NonHDL: 120.52
TRIGLYCERIDES: 89 mg/dL (ref 0.0–149.0)
Total CHOL/HDL Ratio: 4
VLDL: 17.8 mg/dL (ref 0.0–40.0)

## 2014-11-28 LAB — TSH: TSH: 1.34 u[IU]/mL (ref 0.35–4.50)

## 2014-12-03 ENCOUNTER — Encounter: Payer: Self-pay | Admitting: Internal Medicine

## 2014-12-08 ENCOUNTER — Ambulatory Visit (INDEPENDENT_AMBULATORY_CARE_PROVIDER_SITE_OTHER): Payer: BLUE CROSS/BLUE SHIELD | Admitting: Family Medicine

## 2014-12-08 VITALS — BP 100/70 | HR 87 | Temp 98.1°F | Ht 66.5 in | Wt 167.0 lb

## 2014-12-08 DIAGNOSIS — Z0000 Encounter for general adult medical examination without abnormal findings: Secondary | ICD-10-CM | POA: Diagnosis not present

## 2014-12-08 DIAGNOSIS — R3129 Other microscopic hematuria: Secondary | ICD-10-CM

## 2014-12-08 DIAGNOSIS — Z23 Encounter for immunization: Secondary | ICD-10-CM | POA: Diagnosis not present

## 2014-12-08 DIAGNOSIS — R312 Other microscopic hematuria: Secondary | ICD-10-CM | POA: Diagnosis not present

## 2014-12-08 LAB — URINALYSIS, MICROSCOPIC ONLY

## 2014-12-08 LAB — POCT URINALYSIS DIPSTICK
Glucose, UA: NEGATIVE
Leukocytes, UA: NEGATIVE
Nitrite, UA: NEGATIVE
Spec Grav, UA: >=1.03
UROBILINOGEN UA: 1
pH, UA: 6

## 2014-12-08 MED ORDER — MOMETASONE FURO-FORMOTEROL FUM 100-5 MCG/ACT IN AERO: 2.0000 | INHALATION_SPRAY | Freq: Two times a day (BID) | RESPIRATORY_TRACT | Status: DC

## 2014-12-08 MED ORDER — ALBUTEROL SULFATE HFA 108 (90 BASE) MCG/ACT IN AERS: 2.0000 | INHALATION_SPRAY | Freq: Four times a day (QID) | RESPIRATORY_TRACT | Status: DC | PRN

## 2014-12-08 NOTE — Progress Notes (Signed)
   Subjective:    Patient ID: Raymond Torres, male    DOB: 01/01/1966, 49 y.o.   MRN: [id number]  HPI  Patient seen for complete physical. Has history of asthma which has been well controlled with Dulera. Nonsmoker. Family history of prostate cancer in his father and several uncles. Patient had colonoscopy 2 years ago which was normal with no polyps. He's had history of microscopic hematuria on urine dipstick in recent years. No recent urine microscopy. Never had any gross hematuria.  Past Medical History  Diagnosis Date  . Asthma childhood  . Hyperlipidemia   . 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 Microscopic hematuria     history of  . Helicobacter pylori (H. pylori)     history of  . Allergy     rhinitis   Past Surgical History  Procedure Laterality Date  . Achilles tendon repair  08/2009    Dr Marlou Sa    reports that he has never smoked. He has never used smokeless tobacco. He reports that he drinks alcohol. He reports that he does not use illicit drugs. family history includes Cancer in his father and other; Hypertension in his mother; Prostate cancer in his father. There is no history of Colon cancer. Allergies  Allergen Reactions  . Shellfish Allergy      Review of Systems  Constitutional: Negative for fever, activity change, appetite change and fatigue.  HENT: Negative for congestion, ear pain and trouble swallowing.   Eyes: Negative for pain and visual disturbance.  Respiratory: Negative for cough, shortness of breath and wheezing.   Cardiovascular: Negative for chest pain and palpitations.  Gastrointestinal: Negative for nausea, vomiting, abdominal pain, diarrhea, constipation, blood in stool, abdominal distention and rectal pain.  Genitourinary: Negative for dysuria, hematuria and testicular pain.  Musculoskeletal: Negative for joint swelling and arthralgias.  Skin: Negative for rash.  Neurological: Negative for dizziness, syncope  and headaches.  Hematological: Negative for adenopathy.  Psychiatric/Behavioral: Negative for confusion and dysphoric mood.       Objective:   Physical Exam  Constitutional: He is oriented to person, place, and time. He appears well-developed and well-nourished. No distress.  HENT:  Head: Normocephalic and atraumatic.  Right Ear: External ear normal.  Left Ear: External ear normal.  Mouth/Throat: Oropharynx is clear and moist.  Eyes: Conjunctivae and EOM are normal. Pupils are equal, round, and reactive to light.  Neck: Normal range of motion. Neck supple. No thyromegaly present.  Cardiovascular: Normal rate, regular rhythm and normal heart sounds.   No murmur heard. Pulmonary/Chest: No respiratory distress. He has no wheezes. He has no rales.  Abdominal: Soft. Bowel sounds are normal. He exhibits no distension and no mass. There is no tenderness. There is no rebound and no guarding.  Musculoskeletal: He exhibits no edema.  Lymphadenopathy:    He has no cervical adenopathy.  Neurological: He is alert and oriented to person, place, and time. He displays normal reflexes. No cranial nerve deficit.  Skin: No rash noted.  Psychiatric: He has a normal mood and affect.          Assessment & Plan:  Complete physical. Tetanus booster given. Flu vaccine given. Labs reviewed. He has 1+ blood on urine dipstick. Send urine microscopy. Colonoscopy up-to-date.

## 2014-12-08 NOTE — Progress Notes (Signed)
Pre visit review using our clinic review tool, if applicable. No additional management support is needed unless otherwise documented below in the visit note. 

## 2014-12-11 ENCOUNTER — Telehealth: Payer: Self-pay | Admitting: Internal Medicine

## 2014-12-11 NOTE — Telephone Encounter (Signed)
Pt aware of urology referral.  However, pt would like a cb to inform him of what he is to be seen for.  Pt unaware of UA results. pls cb

## 2014-12-11 NOTE — Telephone Encounter (Signed)
Attempted to call patient but unable to leave a message due to "mailbox is not set up yet".

## 2014-12-18 NOTE — Telephone Encounter (Signed)
Left message on machine for patient to return our call 

## 2015-01-21 ENCOUNTER — Encounter: Payer: Self-pay | Admitting: Family

## 2015-01-21 ENCOUNTER — Ambulatory Visit (INDEPENDENT_AMBULATORY_CARE_PROVIDER_SITE_OTHER): Payer: BLUE CROSS/BLUE SHIELD | Admitting: Family

## 2015-01-21 VITALS — BP 100/74 | HR 82 | Temp 98.1°F | Wt 163.6 lb

## 2015-01-21 DIAGNOSIS — S46911A Strain of unspecified muscle, fascia and tendon at shoulder and upper arm level, right arm, initial encounter: Secondary | ICD-10-CM

## 2015-01-21 MED ORDER — DICLOFENAC SODIUM 75 MG PO TBEC: 75.0000 mg | DELAYED_RELEASE_TABLET | Freq: Two times a day (BID) | ORAL | Status: DC

## 2015-01-21 MED ORDER — CYCLOBENZAPRINE HCL 5 MG PO TABS: 5.0000 mg | ORAL_TABLET | Freq: Three times a day (TID) | ORAL | Status: DC | PRN

## 2015-01-21 NOTE — Progress Notes (Signed)
Pre visit review using our clinic review tool, if applicable. No additional management support is needed unless otherwise documented below in the visit note. 

## 2015-01-21 NOTE — Patient Instructions (Signed)

## 2015-01-22 ENCOUNTER — Ambulatory Visit (INDEPENDENT_AMBULATORY_CARE_PROVIDER_SITE_OTHER)
Admission: RE | Admit: 2015-01-22 | Discharge: 2015-01-22 | Disposition: A | Discharge status: Home / Self Care | Payer: BLUE CROSS/BLUE SHIELD | Source: Ambulatory Visit | Attending: Family | Admitting: Family

## 2015-01-22 DIAGNOSIS — S46911A Strain of unspecified muscle, fascia and tendon at shoulder and upper arm level, right arm, initial encounter: Secondary | ICD-10-CM | POA: Diagnosis not present

## 2015-01-23 NOTE — Progress Notes (Signed)
Subjective:    Patient ID: Raymond Torres, male    DOB: 07-31-1965, 49 y.o.   MRN: [id number]  HPI 49 year old African-American male, nonsmoker is in today with complaints of right shoulder pain after playing football on Saturday and falling to the ground. Rates the pain 8 out of 10. Pain initially was mild but has gotten progressively worse. Reports decreased range of motion. Mild swelling. Worse with lifting. Has not taken any medication for relief. Denies any numbness or tingling.   Review of Systems  Constitutional: Negative.   Respiratory: Negative.   Cardiovascular: Negative.   Genitourinary: Negative.   Musculoskeletal: Positive for arthralgias.       Right shoulder pain  Allergic/Immunologic: Negative.   Neurological: Negative.   Hematological: Negative.   Psychiatric/Behavioral: Negative.    Past Medical History  Diagnosis Date  . Asthma childhood  . Hyperlipidemia   . 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 Microscopic hematuria     history of  . Helicobacter pylori (H. pylori)     history of  . Allergy     rhinitis    Social History   Social History  . Marital Status: Married    Spouse Name: N/A  . Number of Children: 3  . Years of Education: N/A   Occupational History  . Manager Other   Social History Main Topics  . Smoking status: Never Smoker   . Smokeless tobacco: Never Used  . Alcohol Use: Yes     Comment: occ  . Drug Use: No  . Sexual Activity: Not on file   Other Topics Concern  . Not on file   Social History Narrative   Works as Freight forwarder at [employer]   3 children ages 40 (daugter), 4 (daughter) and 52 (son)    Past Surgical History  Procedure Laterality Date  . Achilles tendon repair  08/2009    Dr [facility]    Family History  Problem Relation Age of Onset  . Hypertension Mother   . Cancer Father     lung and prostate  . Prostate cancer Father   . Cancer Other     prostate  . Colon  cancer Neg Hx     Allergies  Allergen Reactions  . Shellfish Allergy     Current Outpatient Prescriptions on File Prior to Visit  Medication Sig Dispense Refill  . albuterol (PROVENTIL HFA;VENTOLIN HFA) 108 (90 BASE) MCG/ACT inhaler Inhale 2 puffs into the lungs every 6 (six) hours as needed for wheezing or shortness of breath. 8.5 g 3  . mometasone-formoterol (DULERA) 100-5 MCG/ACT AERO Inhale 2 puffs into the lungs 2 (two) times daily. 13 g 11   No current facility-administered medications on file prior to visit.    BP 100/74 mmHg  Pulse 82  Temp(Src) 98.1 F (36.7 C) (Oral)  Wt 163 lb 9.6 oz (74.208 kg)chart    Objective:   Physical Exam  Constitutional: He is oriented to person, place, and time. He appears well-developed and well-nourished.  Neck: Normal range of motion. Neck supple.  Cardiovascular: Normal rate, regular rhythm and normal heart sounds.   Pulmonary/Chest: Effort normal and breath sounds normal.  Musculoskeletal: He exhibits tenderness. He exhibits no edema.       Arms: Neurological: He is alert and oriented to person, place, and time. He has normal reflexes. He displays normal reflexes. No cranial nerve deficit. Coordination normal.  Skin: Skin is warm and dry.  Psychiatric: He has a normal mood and affect.          Assessment & Plan:  [facility] was seen today for shoulder pain.  Diagnoses and all orders for this visit:  Right shoulder strain, initial encounter -     DG Shoulder Right; Future  Other orders -     cyclobenzaprine (FLEXERIL) 5 MG tablet; Take 1 tablet (5 mg total) by mouth 3 (three) times daily as needed for muscle spasms. -     diclofenac (VOLTAREN) 75 MG EC tablet; Take 1 tablet (75 mg total) by mouth 2 (two) times daily.   Call the office with any questions or concerns. If symptoms persist, refer to orthopedics.

## 2015-04-02 ENCOUNTER — Telehealth: Payer: Self-pay | Admitting: Internal Medicine

## 2015-04-02 NOTE — Telephone Encounter (Signed)
Pt call to say that he had applied for some insurance and they sent over some paperwork to Dr Shawna Orleans. The paperwork is from [employer].

## 2015-04-08 NOTE — Telephone Encounter (Signed)
Informed patient that 28 pages were sent on 03/20/15

## 2015-10-31 ENCOUNTER — Ambulatory Visit (HOSPITAL_COMMUNITY)
Admission: EM | Admit: 2015-10-31 | Discharge: 2015-10-31 | Disposition: A | Discharge status: Home / Self Care | Payer: BLUE CROSS/BLUE SHIELD | Attending: Family Medicine | Admitting: Family Medicine

## 2015-10-31 ENCOUNTER — Encounter (HOSPITAL_COMMUNITY): Payer: Self-pay

## 2015-10-31 DIAGNOSIS — S61219A Laceration without foreign body of unspecified finger without damage to nail, initial encounter: Secondary | ICD-10-CM

## 2015-10-31 DIAGNOSIS — Z23 Encounter for immunization: Secondary | ICD-10-CM

## 2015-10-31 MED ORDER — CEPHALEXIN 500 MG PO CAPS: 500.0000 mg | ORAL_CAPSULE | Freq: Four times a day (QID) | ORAL | 0 refills | Status: DC

## 2015-10-31 MED ORDER — TETANUS-DIPHTH-ACELL PERTUSSIS 5-2.5-18.5 LF-MCG/0.5 IM SUSP
0.5000 mL | Freq: Once | INTRAMUSCULAR | Status: AC
  Administered 2015-10-31: 0.5 mL via INTRAMUSCULAR

## 2015-10-31 MED ORDER — TETANUS-DIPHTH-ACELL PERTUSSIS 5-2.5-18.5 LF-MCG/0.5 IM SUSP
INTRAMUSCULAR | Status: AC
  Filled 2015-10-31: qty 0.5

## 2015-10-31 MED ORDER — HYDROCODONE-ACETAMINOPHEN 5-325 MG PO TABS: 1.0000 | ORAL_TABLET | Freq: Three times a day (TID) | ORAL | 0 refills | Status: DC | PRN

## 2015-10-31 NOTE — Discharge Instructions (Signed)
Do not remove dressing or get wet.    Recommend no going to work until we see you back in the clinic at [facility].  Limit using right hand.   Return to urgent care immediately if any worsening symptoms before you see Dr Lorin Mercy.

## 2015-10-31 NOTE — ED Provider Notes (Signed)
[place]    CSN: [id number] Arrival date & time: 10/31/15  1818  First Provider Contact:  None       History   Chief Complaint Chief Complaint  Patient presents with  . Finger Injury    HPI SHEROD PAXTON is a 50 y.o. male.   HPI Patient states that he was in a junk yard today and he cut the palmar distal tip of his right index finger on a piece of metal on a car.  States at first was hard to stop the bleeding but now this is controlled.  Some pain.  States that he is not sure when he had his last tetanus injection. No other complaints.  Past Medical History:  Diagnosis Date  . Allergy    rhinitis  . Asthma childhood  . Helicobacter pylori (H. pylori)    history of  . History of myocardial infarction    thought secondary to vasospasm  . Hyperlipidemia   . Microscopic hematuria    history of    Patient Active Problem List   Diagnosis Date Noted  . Preventative health care 08/27/2012  . External hemorrhoid 06/22/2012  . Microscopic hematuria 08/25/2011  . Anemia 08/25/2011  . ASTHMA 07/22/2009  . HELICOBACTER PYLORI GASTRITIS 05/23/2007  . MYOCARDIAL INFARCTION, HX OF 05/23/2007  . ALLERGIC RHINITIS 05/23/2007    Past Surgical History:  Procedure Laterality Date  . ACHILLES TENDON REPAIR  08/2009   Dr Marlou Sa       Home Medications    Prior to Admission medications   Medication Sig Start Date End Date Taking? Authorizing Provider  albuterol (PROVENTIL HFA;VENTOLIN HFA) 108 (90 BASE) MCG/ACT inhaler Inhale 2 puffs into the lungs every 6 (six) hours as needed for wheezing or shortness of breath. 12/08/14   Eulas Post, MD  cephALEXin (KEFLEX) 500 MG capsule Take 1 capsule (500 mg total) by mouth 4 (four) times daily. 10/31/15   Lanae Crumbly, PA-C  cyclobenzaprine (FLEXERIL) 5 MG tablet Take 1 tablet (5 mg total) by mouth 3 (three) times daily as needed for muscle spasms. 01/21/15   Kennyth Arnold, FNP  diclofenac (VOLTAREN) 75 MG EC tablet  Take 1 tablet (75 mg total) by mouth 2 (two) times daily. 01/21/15   Kennyth Arnold, FNP  HYDROcodone-acetaminophen (NORCO) 5-325 MG tablet Take 1 tablet by mouth every 8 (eight) hours as needed for moderate pain. 10/31/15   Lanae Crumbly, PA-C  mometasone-formoterol (DULERA) 100-5 MCG/ACT AERO Inhale 2 puffs into the lungs 2 (two) times daily. 12/08/14   Eulas Post, MD    Family History Family History  Problem Relation Age of Onset  . Hypertension Mother   . Cancer Father     lung and prostate  . Prostate cancer Father   . Cancer Other     prostate  . Colon cancer Neg Hx     Social History Social History  Substance Use Topics  . Smoking status: Never Smoker  . Smokeless tobacco: Never Used  . Alcohol use Yes     Comment: occ     Allergies   Shellfish allergy   Review of Systems Review of Systems  Constitutional: Negative.   HENT: Negative.   Eyes: Negative.   Respiratory: Negative.   Cardiovascular: Negative.   Gastrointestinal: Negative.   Endocrine: Negative.   Genitourinary: Negative.   Neurological: Negative.   Hematological: Negative.   Psychiatric/Behavioral: Negative.      Physical Exam Triage Vital Signs  ED Triage Vitals  Enc Vitals Group     BP 10/31/15 1944 125/83     Pulse Rate 10/31/15 1944 62     Resp 10/31/15 1944 15     Temp 10/31/15 1944 98 F (36.7 C)     Temp Source 10/31/15 1944 Oral     SpO2 10/31/15 1944 100 %     Weight --      Height --      Head Circumference --      Peak Flow --      Pain Score 10/31/15 1947 5     Pain Loc --      Pain Edu? --      Excl. in [place]? --    No data found.   Updated Vital Signs BP 125/83 (BP Location: Right Arm)   Pulse 62   Temp 98 F (36.7 C) (Oral)   Resp 15   SpO2 100%   Visual Acuity Right Eye Distance:   Left Eye Distance:   Bilateral Distance:    Right Eye Near:   Left Eye Near:    Bilateral Near:     Physical Exam  Constitutional: He appears well-developed and  well-nourished. No distress.  HENT:  Head: Normocephalic and atraumatic.  Eyes: EOM are normal. Pupils are equal, round, and reactive to light.  Neck: Normal range of motion.  Pulmonary/Chest: No respiratory distress.  Abdominal: He exhibits no distension.  Musculoskeletal:       Right hand: He exhibits tenderness and laceration (distal palmar index finger.  does not appear to involve the tendon. ).       Hands:    UC Treatments / Results  Labs (all labs ordered are listed, but only abnormal results are displayed) Labs Reviewed - No data to display  EKG  EKG Interpretation None       Radiology No results found.  Procedures Procedures (including critical care time)  Medications Ordered in UC Medications  Tdap (BOOSTRIX) injection 0.5 mL (not administered)     Initial Impression / Assessment and Plan / UC Course  I have reviewed the triage vital signs and the nursing notes.  Pertinent labs & imaging results that were available during my care of the patient were reviewed by me and considered in my medical decision making (see chart for details).  Clinical Course      Final Clinical Impressions(s) / UC Diagnoses   Final diagnoses:  Finger laceration, initial encounter    New Prescriptions New Prescriptions   CEPHALEXIN (KEFLEX) 500 MG CAPSULE    Take 1 capsule (500 mg total) by mouth 4 (four) times daily.   HYDROCODONE-ACETAMINOPHEN (NORCO) 5-325 MG TABLET    Take 1 tablet by mouth every 8 (eight) hours as needed for moderate pain.  wound was irrigated today and dressing was applied.  I spoke with orthopedic surgeon Dr Rodell Perna and he agrees with leaving wound open and starting the antibiotic.  Patient will leave dressing on and call [place] Monday morning to schedule appointment to be seen at least by Tuesday.  Do not get dressing wet.  Note for out of work until appointment.  Return to urgent care if symptoms worsen.  All questions answered.  Patient was given Tdap injection.    Lanae Crumbly, PA-C 10/31/15 2121

## 2015-10-31 NOTE — ED Triage Notes (Signed)
Patient presents to [facility] with laceration to pointer finger on right hand, pt states he was working on a car and a part cut his finger No acute distress

## 2017-05-01 ENCOUNTER — Encounter (HOSPITAL_COMMUNITY): Payer: Self-pay

## 2017-05-01 DIAGNOSIS — Z5321 Procedure and treatment not carried out due to patient leaving prior to being seen by health care provider: Secondary | ICD-10-CM | POA: Insufficient documentation

## 2017-05-01 DIAGNOSIS — R109 Unspecified abdominal pain: Secondary | ICD-10-CM | POA: Insufficient documentation

## 2017-05-01 NOTE — ED Triage Notes (Signed)
Pt complains of this groin pain intermittently for a week, he states that sometimes he can feel a knot

## 2017-05-01 NOTE — ED Triage Notes (Signed)
Pt complains of right groin pain when he walks or bends over

## 2017-05-02 ENCOUNTER — Emergency Department (HOSPITAL_COMMUNITY)
Admission: EM | Admit: 2017-05-02 | Discharge: 2017-05-02 | Disposition: A | Discharge status: Home / Self Care | Payer: BLUE CROSS/BLUE SHIELD | Attending: Emergency Medicine | Admitting: Emergency Medicine

## 2017-05-02 NOTE — ED Triage Notes (Signed)
Pt called from triage with no answer 

## 2018-04-03 ENCOUNTER — Encounter: Payer: Self-pay | Admitting: Family Medicine

## 2018-04-04 ENCOUNTER — Encounter: Payer: Self-pay | Admitting: Family Medicine

## 2018-04-04 ENCOUNTER — Ambulatory Visit (INDEPENDENT_AMBULATORY_CARE_PROVIDER_SITE_OTHER): Payer: BLUE CROSS/BLUE SHIELD | Admitting: Family Medicine

## 2018-04-04 VITALS — BP 92/64 | HR 74 | Temp 97.8°F | Ht 67.25 in | Wt 172.0 lb

## 2018-04-04 DIAGNOSIS — K4090 Unilateral inguinal hernia, without obstruction or gangrene, not specified as recurrent: Secondary | ICD-10-CM

## 2018-04-04 DIAGNOSIS — Z125 Encounter for screening for malignant neoplasm of prostate: Secondary | ICD-10-CM

## 2018-04-04 DIAGNOSIS — Z23 Encounter for immunization: Secondary | ICD-10-CM

## 2018-04-04 DIAGNOSIS — Z0000 Encounter for general adult medical examination without abnormal findings: Secondary | ICD-10-CM

## 2018-04-04 LAB — TSH: TSH: 1.54 u[IU]/mL (ref 0.35–4.50)

## 2018-04-04 LAB — BASIC METABOLIC PANEL
BUN: 11 mg/dL (ref 6–23)
CHLORIDE: 104 meq/L (ref 96–112)
CO2: 28 mEq/L (ref 19–32)
Calcium: 9.2 mg/dL (ref 8.4–10.5)
Creatinine, Ser: 0.93 mg/dL (ref 0.40–1.50)
GFR: 108.76 mL/min (ref >60.00)
Glucose, Bld: 75 mg/dL (ref 70–99)
Potassium: 4.1 mEq/L (ref 3.5–5.1)
Sodium: 139 mEq/L (ref 135–145)

## 2018-04-04 LAB — LIPID PANEL
Cholesterol: 175 mg/dL (ref 0–200)
HDL: 48.4 mg/dL (ref >39.00)
LDL Cholesterol: 109 mg/dL — ABNORMAL HIGH (ref 0–99)
NonHDL: 126.86
Total CHOL/HDL Ratio: 4
Triglycerides: 89 mg/dL (ref 0.0–149.0)
VLDL: 17.8 mg/dL (ref 0.0–40.0)

## 2018-04-04 LAB — HEPATIC FUNCTION PANEL
ALT: 14 U/L (ref 0–53)
AST: 17 U/L (ref 0–37)
Albumin: 4.2 g/dL (ref 3.5–5.2)
Alkaline Phosphatase: 75 U/L (ref 39–117)
Bilirubin, Direct: 0.2 mg/dL (ref 0.0–0.3)
Total Bilirubin: 0.9 mg/dL (ref 0.2–1.2)
Total Protein: 6.9 g/dL (ref 6.0–8.3)

## 2018-04-04 LAB — CBC WITH DIFFERENTIAL/PLATELET
Basophils Absolute: 0.1 10*3/uL (ref 0.0–0.1)
Basophils Relative: 1.1 % (ref 0.0–3.0)
Eosinophils Absolute: 0.4 10*3/uL (ref 0.0–0.7)
Eosinophils Relative: 5.9 % — ABNORMAL HIGH (ref 0.0–5.0)
HCT: 42.1 % (ref 39.0–52.0)
Hemoglobin: 13.7 g/dL (ref 13.0–17.0)
Lymphocytes Relative: 41.4 % (ref 12.0–46.0)
Lymphs Abs: 3 10*3/uL (ref 0.7–4.0)
MCHC: 32.5 g/dL (ref 30.0–36.0)
MCV: 90.4 fl (ref 78.0–100.0)
MONO ABS: 0.8 10*3/uL (ref 0.1–1.0)
Monocytes Relative: 10.5 % (ref 3.0–12.0)
Neutro Abs: 3 10*3/uL (ref 1.4–7.7)
Neutrophils Relative %: 41.1 % — ABNORMAL LOW (ref 43.0–77.0)
Platelets: 252 10*3/uL (ref 150.0–400.0)
RBC: 4.65 Mil/uL (ref 4.22–5.81)
RDW: 13 % (ref 11.5–15.5)
WBC: 7.3 10*3/uL (ref 4.0–10.5)

## 2018-04-04 LAB — PSA: PSA: 3.12 ng/mL (ref 0.10–4.00)

## 2018-04-04 NOTE — Progress Notes (Signed)
Subjective:     Patient ID: Raymond Torres, male   DOB: 30-Nov-1965, 53 y.o.   MRN: [id number]  HPI Patient seen for physical exam.  He just recently moved back from [place] here in April.  He is originally from [place].  His wife is from here.  He currently works in [employer].  Non-smoker.  No regular alcohol use.  Patient had tetanus 2017.  Colonoscopy 2014.  No polyps.  No history of shingles vaccine.  Family history significant for father dying of prostate cancer age 52.  He has also had a couple of brothers with prostate cancer and several uncles with prostate cancer.  Past Medical History:  Diagnosis Date  . Allergy    rhinitis  . Asthma childhood  . Helicobacter pylori (H. pylori)    history of  . History of myocardial infarction    thought secondary to vasospasm  . Hyperlipidemia   . Microscopic hematuria    history of   Past Surgical History:  Procedure Laterality Date  . ACHILLES TENDON REPAIR  08/2009   Dr Marlou Sa    reports that he has never smoked. He has never used smokeless tobacco. He reports current alcohol use. He reports that he does not use drugs. family history includes Cancer in his father and another family member; Hypertension in his mother; Prostate cancer in his father. Allergies  Allergen Reactions  . Shellfish Allergy      Review of Systems  Constitutional: Negative for activity change, appetite change, fatigue and fever.  HENT: Negative for congestion, ear pain and trouble swallowing.   Eyes: Negative for pain and visual disturbance.  Respiratory: Negative for cough, shortness of breath and wheezing.   Cardiovascular: Negative for chest pain and palpitations.  Gastrointestinal: Negative for abdominal distention, abdominal pain, blood in stool, constipation, diarrhea, nausea, rectal pain and vomiting.  Genitourinary: Negative for dysuria, hematuria and testicular pain.  Musculoskeletal: Negative for arthralgias and joint swelling.   Skin: Negative for rash.  Neurological: Negative for dizziness, syncope and headaches.  Hematological: Negative for adenopathy.  Psychiatric/Behavioral: Negative for confusion and dysphoric mood.       Objective:   Physical Exam Constitutional:      General: He is not in acute distress.    Appearance: He is well-developed.  HENT:     Head: Normocephalic and atraumatic.     Right Ear: External ear normal.     Left Ear: External ear normal.  Eyes:     Conjunctiva/sclera: Conjunctivae normal.     Pupils: Pupils are equal, round, and reactive to light.  Neck:     Musculoskeletal: Normal range of motion and neck supple.     Thyroid: No thyromegaly.  Cardiovascular:     Rate and Rhythm: Normal rate and regular rhythm.     Heart sounds: Normal heart sounds. No murmur.  Pulmonary:     Effort: No respiratory distress.     Breath sounds: No wheezing or rales.  Abdominal:     General: Bowel sounds are normal. There is no distension.     Palpations: Abdomen is soft.     Tenderness: There is no abdominal tenderness. There is no guarding or rebound.     Comments: He does have right inguinal hernia which is soft and nontender  Genitourinary:    Comments: No rectal mass.  Prostate normal size.  No nodules noted. Lymphadenopathy:     Cervical: No cervical adenopathy.  Skin:    Findings: No rash.  Neurological:     Mental Status: He is alert and oriented to person, place, and time.     Cranial Nerves: No cranial nerve deficit.     Deep Tendon Reflexes: Reflexes normal.        Assessment:     Physical exam.  Patient has very strong family history of prostate cancer as above.  He has right inguinal hernia which apparently just appeared within the past year.    Plan:     -Obtain screening labs including PSA -Flu vaccine given -Check on insurance coverage for shingles vaccine -Set up referral to see general surgeon.  He would like to explore possibly getting repair of his  hernia  Eulas Post MD [facility] at [facility]

## 2018-04-04 NOTE — Patient Instructions (Signed)

## 2018-08-06 ENCOUNTER — Other Ambulatory Visit: Payer: Self-pay

## 2018-08-06 ENCOUNTER — Other Ambulatory Visit (INDEPENDENT_AMBULATORY_CARE_PROVIDER_SITE_OTHER): Payer: Self-pay

## 2018-08-06 DIAGNOSIS — R972 Elevated prostate specific antigen [PSA]: Secondary | ICD-10-CM

## 2018-08-06 LAB — PSA: PSA: 3.74 ng/mL (ref 0.10–4.00)

## 2018-08-07 ENCOUNTER — Telehealth: Payer: Self-pay | Admitting: *Deleted

## 2018-08-07 ENCOUNTER — Other Ambulatory Visit: Payer: Self-pay

## 2018-08-07 DIAGNOSIS — R972 Elevated prostate specific antigen [PSA]: Secondary | ICD-10-CM

## 2018-08-07 NOTE — Telephone Encounter (Signed)
-----   Message from Carlisle Beers, RN sent at 08/07/2018  5:02 PM EDT ----- Pt given lab results per notes of Dr Elease Hashimoto on 07/27/18. Pt verbalized understanding. Pt agreeable to referral to urology.

## 2018-08-08 NOTE — Telephone Encounter (Signed)
Urology referral has been ordered.

## 2018-11-29 DIAGNOSIS — R972 Elevated prostate specific antigen [PSA]: Secondary | ICD-10-CM | POA: Diagnosis not present

## 2018-11-29 DIAGNOSIS — C61 Malignant neoplasm of prostate: Secondary | ICD-10-CM | POA: Diagnosis not present

## 2018-12-06 DIAGNOSIS — C61 Malignant neoplasm of prostate: Secondary | ICD-10-CM | POA: Diagnosis not present

## 2018-12-07 ENCOUNTER — Encounter: Payer: Self-pay | Admitting: *Deleted

## 2018-12-10 ENCOUNTER — Other Ambulatory Visit: Payer: Self-pay | Admitting: Family Medicine

## 2018-12-10 NOTE — Telephone Encounter (Signed)
Medication Refill - Medication:mometasone-formoterol (DULERA) 100-5 MCG/ACT AERO  Has the patient contacted their pharmacy? Yes.   (Agent: If no, request that the patient contact the pharmacy for the refill.) (Agent: If yes, when and what did the pharmacy advise?)  Preferred Pharmacy (with phone number or street name): [id number] [address], [place] [phone] (Phone) [phone] (Fax)     Agent: Please be advised that RX refills may take up to 3 business days. We ask that you follow-up with your pharmacy.

## 2018-12-10 NOTE — Telephone Encounter (Signed)
Requested medication (s) are due for refill today - if to continue  Requested medication (s) are on the active medication list - yes  Future visit scheduled -no  Last refill: 12/08/14  Notes to clinic: Patient is requesting a refill of expired medication. Sent for PCP review   Requested Prescriptions  Pending Prescriptions Disp Refills   mometasone-formoterol (DULERA) 100-5 MCG/ACT AERO 13 g 11    Sig: Inhale 2 puffs into the lungs 2 (two) times daily.     There is no refill protocol information for this order       Requested Prescriptions  Pending Prescriptions Disp Refills   mometasone-formoterol (DULERA) 100-5 MCG/ACT AERO 13 g 11    Sig: Inhale 2 puffs into the lungs 2 (two) times daily.     There is no refill protocol information for this order

## 2018-12-11 MED ORDER — DULERA 100-5 MCG/ACT IN AERO: 2.0000 | INHALATION_SPRAY | Freq: Two times a day (BID) | RESPIRATORY_TRACT | 11 refills | Status: DC

## 2018-12-12 NOTE — Progress Notes (Signed)
GU Location of Tumor / Histology: prostatic adenocarcinoma  If Prostate Cancer, Gleason Score is (3 + 3) and PSA is (3.74). Prostate volume: 33.74  Raymond Torres had a slowly rising PSA along with strong family history of prostate ca.   Biopsies of prostate (if applicable) revealed:    Past/Anticipated interventions by urology, if any: prostate biopsy, referral to Dr. Tammi Klippel to discuss radiation options  Past/Anticipated interventions by medical oncology, if any: no  Weight changes, if any: no  Bowel/Bladder complaints, if any: IPSS 15. SHIM 23. Denies dysuria or hematuria.Reports urinary frequency, urgency, nocturia x 3, post void dribble, and intermittent weak stream. Denies ED. Reports taking flomax x 2 week without relief thus was directed by Dr. Gloriann Loan to stop.  Nausea/Vomiting, if any: no  Pain issues, if any:  no  SAFETY ISSUES:  Prior radiation? no  Pacemaker/ICD? no  Possible current pregnancy? no, male patient  Is the patient on methotrexate? no  Current Complaints / other details:  53 year old male. Single/Divorced. 1, son and 5 daughters. Reports father had prostate and lung ca. Reports both paternal and maternal uncles had prostate cancer. Reports one of his uncles had a prostatectomy.

## 2018-12-14 ENCOUNTER — Other Ambulatory Visit: Payer: Self-pay

## 2018-12-14 ENCOUNTER — Ambulatory Visit
Admission: RE | Admit: 2018-12-14 | Discharge: 2018-12-14 | Disposition: A | Discharge status: Home / Self Care | Payer: Medicaid Other | Source: Ambulatory Visit | Attending: Radiation Oncology | Admitting: Radiation Oncology

## 2018-12-14 ENCOUNTER — Encounter: Payer: Self-pay | Admitting: Radiation Oncology

## 2018-12-14 VITALS — Ht 67.0 in | Wt 175.0 lb

## 2018-12-14 DIAGNOSIS — C61 Malignant neoplasm of prostate: Secondary | ICD-10-CM | POA: Insufficient documentation

## 2018-12-14 HISTORY — DX: Malignant neoplasm of prostate: C61

## 2018-12-14 NOTE — Progress Notes (Signed)
Radiation Oncology         [phone] ________________________________  Initial outpatient Consultation - Conducted via MyChart due to current COVID-19 concerns for limiting patient exposure  Name: Raymond Torres MRN: [id number]  Date: 12/14/2018  DOB: 1966-03-01  [id number], Alinda Sierras, MD  Lucas Mallow, MD   REFERRING PHYSICIAN: Lucas Mallow, MD  DIAGNOSIS: 53 y.o. gentleman with Stage T1c adenocarcinoma of the prostate with Gleason score of 3+3, and PSA of 3.74.    ICD-10-CM   1. Malignant neoplasm of prostate ([place])  C61     HISTORY OF PRESENT ILLNESS: Raymond Torres is a 53 y.o. male with a diagnosis of prostate cancer. He was noted to have a rising PSA from 1.84 in 2017 to 3.12 in 03/2018 and 3.74 in 07/2018 by his primary care physician, Dr. Elease Hashimoto. He was also experiencing some mild urinary symptoms at that time, and he has a strong family history of prostate cancer. Accordingly, he was referred for evaluation in urology by Dr. Gloriann Loan on 09/25/2018,  digital rectal examination performed at that time was without nodularity or concerning findings.  He was started on Flomax which he took for 2 weeks without benefit. The patient proceeded to transrectal ultrasound with 12 biopsies of the prostate on 11/29/2018.  The prostate volume measured 33.74 cc.  Out of 12 core biopsies, 4 were positive.  The maximum Gleason score was 3+3, and this was seen in left base, left mid lateral (small focus), right mid, and right apex.  The patient reviewed the biopsy results with his urologist and he has kindly been referred today for discussion of potential radiation treatment options.  PREVIOUS RADIATION THERAPY: No  PAST MEDICAL HISTORY:  Past Medical History:  Diagnosis Date  . Allergy    rhinitis  . Asthma childhood  . Helicobacter pylori (H. pylori)    history of  . History of myocardial infarction    thought secondary to vasospasm  . Hyperlipidemia   . Microscopic hematuria    history of  . Prostate cancer ([place])       PAST SURGICAL HISTORY: Past Surgical History:  Procedure Laterality Date  . ACHILLES TENDON REPAIR  08/2009   Dr Marlou Sa  . PROSTATE BIOPSY      FAMILY HISTORY:  Family History  Problem Relation Age of Onset  . Hypertension Mother   . Cancer Father        lung and prostate  . Prostate cancer Father   . Prostate cancer Maternal Uncle   . Prostate cancer Paternal Uncle   . Colon cancer Neg Hx     SOCIAL HISTORY:  Social History   Socioeconomic History  . Marital status: Married    Spouse name: Not on file  . Number of children: 3  . Years of education: Not on file  . Highest education level: Not on file  Occupational History  . Occupation: Best boy: [name]  . Financial resource strain: Not on file  . Food insecurity    Worry: Not on file    Inability: Not on file  . Transportation needs    Medical: Not on file    Non-medical: Not on file  Tobacco Use  . Smoking status: Never Smoker  . Smokeless tobacco: Never Used  Substance and Sexual Activity  . Alcohol use: Yes    Comment: occ  . Drug use: No  . Sexual activity: Yes  Lifestyle  . Physical  activity    Days per week: Not on file    Minutes per session: Not on file  . Stress: Not on file  Relationships  . Social Herbalist on phone: Not on file    Gets together: Not on file    Attends religious service: Not on file    Active member of club or organization: Not on file    Attends meetings of clubs or organizations: Not on file    Relationship status: Not on file  . Intimate partner violence    Fear of current or ex partner: Not on file    Emotionally abused: Not on file    Physically abused: Not on file    Forced sexual activity: Not on file  Other Topics Concern  . Not on file  Social History Narrative   Works as Freight forwarder at [employer]   3 children ages 3 (daugter), 76 (daughter) and 6 (son)    ALLERGIES:  Shellfish allergy  MEDICATIONS:  Current Outpatient Medications  Medication Sig Dispense Refill  . mometasone-formoterol (DULERA) 100-5 MCG/ACT AERO Inhale 2 puffs into the lungs 2 (two) times daily. 13 g 11  . tamsulosin (FLOMAX) 0.4 MG CAPS capsule Take 0.4 mg by mouth daily.     No current facility-administered medications for this encounter.     REVIEW OF SYSTEMS:  On review of systems, the patient reports that he is doing well overall. He denies any chest pain, shortness of breath, cough, fevers, chills, night sweats, unintended weight changes. He denies any bowel disturbances, and denies abdominal pain, nausea or vomiting. He denies any new musculoskeletal or joint aches or pains. His IPSS was 15, indicating moderate urinary symptoms with urinary frequency, urgency, nocturia x3, post-void dribble, and intermittent/weak stream. He states he was given flomax, but he didn't notice any relief after 2 weeks so he discontinued use. His SHIM was 23, indicating he does not have erectile dysfunction. A complete review of systems is obtained and is otherwise negative.   PHYSICAL EXAM:  Wt Readings from Last 3 Encounters:  12/14/18 175 lb (79.4 kg)  04/04/18 172 lb (78 kg)  01/21/15 163 lb 9.6 oz (74.2 kg)   Temp Readings from Last 3 Encounters:  04/04/18 97.8 F (36.6 C) (Oral)  05/01/17 97.7 F (36.5 C) (Oral)  10/31/15 98 F (36.7 C) (Oral)   BP Readings from Last 3 Encounters:  04/04/18 92/64  05/01/17 (!) 142/93  10/31/15 125/83   Pulse Readings from Last 3 Encounters:  04/04/18 74  05/01/17 66  10/31/15 62   Pain Assessment Pain Score: 0-No pain/10  In general this is a well appearing African American male in no acute distress. He's alert and oriented x4 and appropriate throughout the examination. Cardiopulmonary assessment is negative for acute distress and he exhibits normal effort.    KPS = 100  100 - Normal; no complaints; no evidence of disease. 90   - Able to  carry on normal activity; minor signs or symptoms of disease. 80   - Normal activity with effort; some signs or symptoms of disease. 71   - Cares for self; unable to carry on normal activity or to do active work. 60   - Requires occasional assistance, but is able to care for most of his personal needs. 50   - Requires considerable assistance and frequent medical care. 97   - Disabled; requires special care and assistance. 30   - Severely disabled; hospital admission  is indicated although death not imminent. 79   - Very sick; hospital admission necessary; active supportive treatment necessary. 10   - Moribund; fatal processes progressing rapidly. 0     - Dead  Karnofsky DA, Abelmann Humble, Craver LS and Burchenal [facility] [phone]) The use of the nitrogen mustards in the palliative treatment of carcinoma: with particular reference to bronchogenic carcinoma Cancer 1 634-56  LABORATORY DATA:  Lab Results  Component Value Date   WBC 7.3 04/04/2018   HGB 13.7 04/04/2018   HCT 42.1 04/04/2018   MCV 90.4 04/04/2018   PLT 252.0 04/04/2018   Lab Results  Component Value Date   NA 139 04/04/2018   K 4.1 04/04/2018   CL 104 04/04/2018   CO2 28 04/04/2018   Lab Results  Component Value Date   ALT 14 04/04/2018   AST 17 04/04/2018   ALKPHOS 75 04/04/2018   BILITOT 0.9 04/04/2018     RADIOGRAPHY: No results found.    IMPRESSION/PLAN:  This visit was conducted via MyChart to spare the patient unnecessary potential exposure in the healthcare setting during the current COVID-19 pandemic.  1. 53 y.o. male with Stage T1c adenocarcinoma of the prostate with Gleason Score of 3+3, and PSA of 3.74. We discussed the patient's workup and outlined the nature of prostate cancer in this setting. The patient's T stage, Gleason's score, and PSA put him into the low risk group. Accordingly, he is eligible for a variety of potential treatment options including active surveillance, brachytherapy, 5.5 weeks of external  radiation, or prostatectomy. He is not comfortable with proceeding in Active Surveillance and prefers to move forward with treatment of curative intent.  We discussed the available radiation techniques, and focused on the details of logistics and delivery.  We discussed and outlined the risks, benefits, short and long-term effects associated with radiotherapy and compared and contrasted these with prostatectomy. We discussed the role of SpaceOAR in reducing the rectal toxicity associated with radiotherapy.   At the conclusion of our conversation, the patient remains undecided regarding his treatment preference and would like to take more time to consider his options. He is leaning towards either prostatectomy or brachytherapy and plans to reach a final decision within the next 1-2 weeks. We will share our discussion with Dr. Gloriann Loan and plan to follow up with the patient if we have not heard back from him in the next 2 weeks. If he elects to proceed with brachytherapy, we will move forward with treatment planning accordingly at that time.  We enjoyed meeting him today and would be more than happy to continue to participate in his care going forward.   Given current concerns for patient exposure during the COVID-19 pandemic, this encounter was conducted via video-enabled MyChart visit. The patient has given verbal consent for this type of encounter. The time spent during this encounter was 60 minutes. The attendants for this meeting include Tyler Pita MD, Ashlyn Bruning PA-C, [place], and patient, Ekamveer Caramanica. During the encounter, Tyler Pita MD, Ashlyn Bruning PA-C, and scribe, Wilburn Mylar were located at [facility].  Patient, Raymond Torres was located at home.    Nicholos Johns, PA-C    Tyler Pita, MD  [facility] Direct Dial: [phone]  Fax: [phone] [email or web address]  Skype  [email or web address]   This document serves as a record of services personally performed by Tyler Pita, MD and Freeman Caldron, PA-C. It was created on their behalf by  Wilburn Mylar, a trained medical scribe. The creation of this record is based on the scribe's personal observations and the provider's statements to them. This document has been checked and approved by the attending provider.

## 2018-12-18 ENCOUNTER — Telehealth: Payer: Self-pay | Admitting: *Deleted

## 2018-12-18 ENCOUNTER — Telehealth: Payer: Self-pay | Admitting: Medical Oncology

## 2018-12-18 NOTE — Telephone Encounter (Signed)
Spoke with patient to introduce myself as the prostate nurse navigator and discuss my role. I was unable to meet him 9/25 when he consulted with Dr. Tammi Klippel. He states the consult went very well and he was pleased. He has decided on brachytherapy as treatment.I forwarded his decision to [employer].I gave him my contact information and asked him to call me with questions or concerns. He is aware that Enid Derry will be in contact with appointments. He voiced understanding of the above.

## 2018-12-18 NOTE — Telephone Encounter (Signed)
CALLED PATIENT TO INFORM OF PRE-SEED APPTS. FOR 01-10-19, SPOKE WITH PATIENT AND HE IS AWARE OF THESE APPTS.

## 2018-12-19 ENCOUNTER — Other Ambulatory Visit: Payer: Self-pay | Admitting: Urology

## 2018-12-20 ENCOUNTER — Telehealth: Payer: Self-pay | Admitting: *Deleted

## 2018-12-20 NOTE — Telephone Encounter (Signed)
CALLED PATIENT TO INFORM OF PRE-SEED APPTS. FOR 01-10-19 AND HIS IMPLANT ON 01-28-19, SPOKE WITH PATIENT AND HE IS AWARE OF THESE APPTS.

## 2018-12-21 ENCOUNTER — Other Ambulatory Visit: Payer: Self-pay | Admitting: Urology

## 2018-12-21 DIAGNOSIS — C61 Malignant neoplasm of prostate: Secondary | ICD-10-CM

## 2018-12-26 ENCOUNTER — Other Ambulatory Visit: Payer: Self-pay | Admitting: Urology

## 2019-01-09 ENCOUNTER — Telehealth: Payer: Self-pay | Admitting: *Deleted

## 2019-01-09 NOTE — Telephone Encounter (Signed)
CALLED PATIENT TO REMIND OF PRE-SEED APPTS. FOR 01/10/19, SPOKE WITH PATIENT AND HE IS AWARE OF THESE APPTS

## 2019-01-10 ENCOUNTER — Ambulatory Visit (HOSPITAL_COMMUNITY)
Admission: RE | Admit: 2019-01-10 | Discharge: 2019-01-10 | Disposition: A | Discharge status: Home / Self Care | Payer: BC Managed Care – PPO | Source: Ambulatory Visit | Attending: Urology | Admitting: Urology

## 2019-01-10 ENCOUNTER — Encounter: Payer: Self-pay | Admitting: Medical Oncology

## 2019-01-10 ENCOUNTER — Ambulatory Visit
Admission: RE | Admit: 2019-01-10 | Discharge: 2019-01-10 | Disposition: A | Discharge status: Home / Self Care | Payer: BC Managed Care – PPO | Source: Ambulatory Visit | Attending: Urology | Admitting: Urology

## 2019-01-10 ENCOUNTER — Other Ambulatory Visit: Payer: Self-pay

## 2019-01-10 ENCOUNTER — Encounter (HOSPITAL_COMMUNITY)
Admission: RE | Admit: 2019-01-10 | Discharge: 2019-01-10 | Disposition: A | Discharge status: Home / Self Care | Payer: BC Managed Care – PPO | Source: Ambulatory Visit | Attending: Urology | Admitting: Urology

## 2019-01-10 ENCOUNTER — Ambulatory Visit
Admission: RE | Admit: 2019-01-10 | Discharge: 2019-01-10 | Disposition: A | Discharge status: Home / Self Care | Payer: BC Managed Care – PPO | Source: Ambulatory Visit | Attending: Radiation Oncology | Admitting: Radiation Oncology

## 2019-01-10 DIAGNOSIS — Z01818 Encounter for other preprocedural examination: Secondary | ICD-10-CM | POA: Diagnosis not present

## 2019-01-10 DIAGNOSIS — C61 Malignant neoplasm of prostate: Secondary | ICD-10-CM | POA: Insufficient documentation

## 2019-01-13 NOTE — Progress Notes (Signed)
  Radiation Oncology         [phone] ________________________________  Name: CHIJIOKE LUNGREN MRN: [id number]  Date: 01/10/2019  DOB: 1966/01/14  SIMULATION AND TREATMENT PLANNING NOTE PUBIC ARCH STUDY  [id number], Alinda Sierras, MD  Eulas Post, MD  DIAGNOSIS: 53 y.o. gentleman with Stage T1c adenocarcinoma of the prostate with Gleason score of 3+3, and PSA of 3.74     ICD-10-CM   1. Malignant neoplasm of prostate ([place])  C61     COMPLEX SIMULATION:  The patient presented today for evaluation for possible prostate seed implant. He was brought to the radiation planning suite and placed supine on the CT couch. A 3-dimensional image study set was obtained in upload to the planning computer. There, on each axial slice, I contoured the prostate gland. Then, using three-dimensional radiation planning tools I reconstructed the prostate in view of the structures from the transperineal needle pathway to assess for possible pubic arch interference. In doing so, I did not appreciate any pubic arch interference. Also, the patient's prostate volume was estimated based on the drawn structure. The volume was 33 cc.  Given the pubic arch appearance and prostate volume, patient remains a good candidate to proceed with prostate seed implant. Today, he freely provided informed written consent to proceed.    PLAN: The patient will undergo prostate seed implant.   ________________________________  Sheral Apley. Tammi Klippel, M.D.

## 2019-01-23 ENCOUNTER — Telehealth: Payer: Self-pay | Admitting: *Deleted

## 2019-01-23 NOTE — Telephone Encounter (Signed)
Called patient to remind of lab and COVID- 19 test for 01-24-19, spoke with patient and he is aware of these appts.

## 2019-01-24 ENCOUNTER — Other Ambulatory Visit (HOSPITAL_COMMUNITY)
Admission: RE | Admit: 2019-01-24 | Discharge: 2019-01-24 | Disposition: A | Discharge status: Home / Self Care | Payer: BC Managed Care – PPO | Source: Ambulatory Visit | Attending: Urology | Admitting: Urology

## 2019-01-24 ENCOUNTER — Other Ambulatory Visit: Payer: Self-pay

## 2019-01-24 ENCOUNTER — Encounter (HOSPITAL_COMMUNITY)
Admission: RE | Admit: 2019-01-24 | Discharge: 2019-01-24 | Disposition: A | Discharge status: Home / Self Care | Payer: BC Managed Care – PPO | Source: Ambulatory Visit | Attending: Urology | Admitting: Urology

## 2019-01-24 DIAGNOSIS — Z01812 Encounter for preprocedural laboratory examination: Secondary | ICD-10-CM | POA: Insufficient documentation

## 2019-01-24 DIAGNOSIS — Z20828 Contact with and (suspected) exposure to other viral communicable diseases: Secondary | ICD-10-CM | POA: Diagnosis not present

## 2019-01-24 LAB — CBC
HCT: 40.3 % (ref 39.0–52.0)
Hemoglobin: 12.9 g/dL — ABNORMAL LOW (ref 13.0–17.0)
MCH: 29.1 pg (ref 26.0–34.0)
MCHC: 32 g/dL (ref 30.0–36.0)
MCV: 90.8 fL (ref 80.0–100.0)
Platelets: 273 10*3/uL (ref 150–400)
RBC: 4.44 MIL/uL (ref 4.22–5.81)
RDW: 12 % (ref 11.5–15.5)
WBC: 5.2 10*3/uL (ref 4.0–10.5)
nRBC: 0 % (ref 0.0–0.2)

## 2019-01-24 LAB — COMPREHENSIVE METABOLIC PANEL
ALT: 20 U/L (ref 0–44)
AST: 21 U/L (ref 15–41)
Albumin: 3.8 g/dL (ref 3.5–5.0)
Alkaline Phosphatase: 72 U/L (ref 38–126)
Anion gap: 11 (ref 5–15)
BUN: 9 mg/dL (ref 6–20)
CO2: 23 mmol/L (ref 22–32)
Calcium: 8.6 mg/dL — ABNORMAL LOW (ref 8.9–10.3)
Chloride: 104 mmol/L (ref 98–111)
Creatinine, Ser: 1.05 mg/dL (ref 0.61–1.24)
GFR calc Af Amer: >60 mL/min (ref >60)
GFR calc non Af Amer: >60 mL/min (ref >60)
Glucose, Bld: 123 mg/dL — ABNORMAL HIGH (ref 70–99)
Potassium: 3.3 mmol/L — ABNORMAL LOW (ref 3.5–5.1)
Sodium: 138 mmol/L (ref 135–145)
Total Bilirubin: 1.3 mg/dL — ABNORMAL HIGH (ref 0.3–1.2)
Total Protein: 6.9 g/dL (ref 6.5–8.1)

## 2019-01-24 LAB — PROTIME-INR
INR: 1 (ref 0.8–1.2)
Prothrombin Time: 13.5 seconds (ref 11.4–15.2)

## 2019-01-24 LAB — APTT: aPTT: 32 seconds (ref 24–36)

## 2019-01-25 ENCOUNTER — Encounter (HOSPITAL_BASED_OUTPATIENT_CLINIC_OR_DEPARTMENT_OTHER): Payer: Self-pay | Admitting: *Deleted

## 2019-01-25 ENCOUNTER — Telehealth: Payer: Self-pay | Admitting: *Deleted

## 2019-01-25 ENCOUNTER — Other Ambulatory Visit: Payer: Self-pay

## 2019-01-25 LAB — NOVEL CORONAVIRUS, NAA (HOSP ORDER, SEND-OUT TO REF LAB; TAT 18-24 HRS): SARS-CoV-2, NAA: NOT DETECTED

## 2019-01-25 NOTE — Progress Notes (Signed)
Spoke w/ via phone for pre-op interview--- PT Lab needs dos----  None             Lab results------ CBC,CMP,PT/PTT done 01-24-2019 in chart/ epic COVID test ------ 01-24-2019 Arrive at ------- 0945 NPO after ------ [place] Medications to take morning of surgery ----- NONE Diabetic medication ----- n/a Patient Special Instructions ----- do one fleet enema am dos .  Bring rescue inhaler dos Pre-Op special Istructions ----- current ekg/ cxr in chart/ epic  Patient verbalized understanding of instructions that were given at this phone interview. Patient denies shortness of breath, chest pain, fever, cough a this phone interview.   Anesthesia:  Hx NSTEMI 06/ 2007 (per cardiology thought to be coronary spasm). Pt stated has not had any cardaic s&s since 2007)  PCP:  Dr Carolann Littler Cardiologist :  Back in 2007 w/ dr g. Lovena Le note in epic Chest x-ray : 01-10-2019 epic EKG : 01-10-2019 epic Stress test:  no Echo : no Cardiac Cath :  09-13-2005 & 01-04-2006 epic Sleep Study/ CPAP : NO  Blood Thinner/ Instructions Maryjane Hurter Dose: NO ASA / Instructions/ Last Dose :  NO.

## 2019-01-25 NOTE — Telephone Encounter (Signed)
Called patient to remind of procedure for 01-28-19, spoke with patient and he is aware of this procedure.

## 2019-01-28 ENCOUNTER — Encounter (HOSPITAL_BASED_OUTPATIENT_CLINIC_OR_DEPARTMENT_OTHER)
Admission: RE | Disposition: A | Discharge status: Home / Self Care | Payer: Self-pay | Source: Ambulatory Visit | Attending: Urology

## 2019-01-28 ENCOUNTER — Ambulatory Visit (HOSPITAL_COMMUNITY): Payer: BC Managed Care – PPO

## 2019-01-28 ENCOUNTER — Ambulatory Visit (HOSPITAL_BASED_OUTPATIENT_CLINIC_OR_DEPARTMENT_OTHER): Payer: BC Managed Care – PPO | Admitting: Certified Registered Nurse Anesthetist

## 2019-01-28 ENCOUNTER — Encounter (HOSPITAL_BASED_OUTPATIENT_CLINIC_OR_DEPARTMENT_OTHER): Payer: Self-pay | Admitting: Certified Registered Nurse Anesthetist

## 2019-01-28 ENCOUNTER — Ambulatory Visit (HOSPITAL_BASED_OUTPATIENT_CLINIC_OR_DEPARTMENT_OTHER)
Admission: RE | Admit: 2019-01-28 | Discharge: 2019-01-28 | Disposition: A | Discharge status: Home / Self Care | Payer: BC Managed Care – PPO | Source: Ambulatory Visit | Attending: Urology | Admitting: Urology

## 2019-01-28 ENCOUNTER — Other Ambulatory Visit: Payer: Self-pay

## 2019-01-28 DIAGNOSIS — C61 Malignant neoplasm of prostate: Secondary | ICD-10-CM | POA: Diagnosis not present

## 2019-01-28 DIAGNOSIS — K644 Residual hemorrhoidal skin tags: Secondary | ICD-10-CM | POA: Diagnosis not present

## 2019-01-28 DIAGNOSIS — Z8042 Family history of malignant neoplasm of prostate: Secondary | ICD-10-CM | POA: Diagnosis not present

## 2019-01-28 DIAGNOSIS — I252 Old myocardial infarction: Secondary | ICD-10-CM | POA: Insufficient documentation

## 2019-01-28 DIAGNOSIS — J45909 Unspecified asthma, uncomplicated: Secondary | ICD-10-CM | POA: Diagnosis not present

## 2019-01-28 DIAGNOSIS — D649 Anemia, unspecified: Secondary | ICD-10-CM | POA: Diagnosis not present

## 2019-01-28 HISTORY — DX: Allergic rhinitis, unspecified: J30.9

## 2019-01-28 HISTORY — DX: Nocturia: R35.1

## 2019-01-28 HISTORY — PX: RADIOACTIVE SEED IMPLANT: SHX5150

## 2019-01-28 HISTORY — DX: Benign prostatic hyperplasia with lower urinary tract symptoms: N40.1

## 2019-01-28 HISTORY — PX: SPACE OAR INSTILLATION: SHX6769

## 2019-01-28 HISTORY — DX: Personal history of other infectious and parasitic diseases: Z86.19

## 2019-01-28 HISTORY — DX: Presence of spectacles and contact lenses: Z97.3

## 2019-01-28 HISTORY — DX: Frequency of micturition: R35.0

## 2019-01-28 HISTORY — DX: Old myocardial infarction: I25.2

## 2019-01-28 SURGERY — INSERTION, RADIATION SOURCE, PROSTATE
Anesthesia: General | Site: Prostate

## 2019-01-28 MED ORDER — PHENYLEPHRINE 40 MCG/ML (10ML) SYRINGE FOR IV PUSH (FOR BLOOD PRESSURE SUPPORT)
PREFILLED_SYRINGE | INTRAVENOUS | Status: AC
  Filled 2019-01-28: qty 10

## 2019-01-28 MED ORDER — PROPOFOL 10 MG/ML IV BOLUS
INTRAVENOUS | Status: DC | PRN
  Administered 2019-01-28: 150 mg via INTRAVENOUS

## 2019-01-28 MED ORDER — LIDOCAINE HCL (CARDIAC) PF 100 MG/5ML IV SOSY
PREFILLED_SYRINGE | INTRAVENOUS | Status: DC | PRN
  Administered 2019-01-28: 50 mg via INTRAVENOUS

## 2019-01-28 MED ORDER — SODIUM CHLORIDE 0.9 % IV SOLN
INTRAVENOUS | Status: AC | PRN
  Administered 2019-01-28: 1000 mL

## 2019-01-28 MED ORDER — ONDANSETRON HCL 4 MG/2ML IJ SOLN
INTRAMUSCULAR | Status: AC
  Filled 2019-01-28: qty 2

## 2019-01-28 MED ORDER — DEXAMETHASONE SODIUM PHOSPHATE 4 MG/ML IJ SOLN
INTRAMUSCULAR | Status: DC | PRN
  Administered 2019-01-28: 5 mg via INTRAVENOUS

## 2019-01-28 MED ORDER — IOHEXOL 300 MG/ML  SOLN
INTRAMUSCULAR | Status: DC | PRN
  Administered 2019-01-28: 12:00:00 7 mL

## 2019-01-28 MED ORDER — MIDAZOLAM HCL 2 MG/2ML IJ SOLN
INTRAMUSCULAR | Status: AC
  Filled 2019-01-28: qty 2

## 2019-01-28 MED ORDER — FENTANYL CITRATE (PF) 100 MCG/2ML IJ SOLN
INTRAMUSCULAR | Status: AC
  Filled 2019-01-28: qty 2

## 2019-01-28 MED ORDER — LACTATED RINGERS IV SOLN
INTRAVENOUS | Status: DC
  Administered 2019-01-28: 1000 mL via INTRAVENOUS
  Administered 2019-01-28: 13:00:00 via INTRAVENOUS
  Filled 2019-01-28: qty 1000

## 2019-01-28 MED ORDER — FENTANYL CITRATE (PF) 100 MCG/2ML IJ SOLN
25.0000 ug | INTRAMUSCULAR | Status: DC | PRN
  Administered 2019-01-28: 25 ug via INTRAVENOUS
  Filled 2019-01-28: qty 1

## 2019-01-28 MED ORDER — MEPERIDINE HCL 25 MG/ML IJ SOLN
6.2500 mg | INTRAMUSCULAR | Status: DC | PRN
  Filled 2019-01-28: qty 1

## 2019-01-28 MED ORDER — GLYCOPYRROLATE 0.2 MG/ML IJ SOLN
INTRAMUSCULAR | Status: DC | PRN
  Administered 2019-01-28 (×2): 0.1 mg via INTRAVENOUS

## 2019-01-28 MED ORDER — ONDANSETRON HCL 4 MG/2ML IJ SOLN
4.0000 mg | Freq: Once | INTRAMUSCULAR | Status: DC | PRN
  Filled 2019-01-28: qty 2

## 2019-01-28 MED ORDER — OXYCODONE HCL 5 MG PO TABS
5.0000 mg | ORAL_TABLET | Freq: Once | ORAL | Status: DC | PRN
  Filled 2019-01-28: qty 1

## 2019-01-28 MED ORDER — PHENYLEPHRINE HCL (PRESSORS) 10 MG/ML IV SOLN
INTRAVENOUS | Status: DC | PRN
  Administered 2019-01-28 (×3): 80 ug via INTRAVENOUS
  Administered 2019-01-28: 120 ug via INTRAVENOUS

## 2019-01-28 MED ORDER — HYDROCODONE-ACETAMINOPHEN 5-325 MG PO TABS: 1.0000 | ORAL_TABLET | ORAL | 0 refills | Status: DC | PRN

## 2019-01-28 MED ORDER — KETOROLAC TROMETHAMINE 30 MG/ML IJ SOLN
30.0000 mg | Freq: Once | INTRAMUSCULAR | Status: DC | PRN
  Filled 2019-01-28: qty 1

## 2019-01-28 MED ORDER — EPHEDRINE 5 MG/ML INJ
INTRAVENOUS | Status: AC
  Filled 2019-01-28: qty 10

## 2019-01-28 MED ORDER — SODIUM CHLORIDE (PF) 0.9 % IJ SOLN
INTRAMUSCULAR | Status: DC | PRN
  Administered 2019-01-28: 10 mL

## 2019-01-28 MED ORDER — PROPOFOL 10 MG/ML IV BOLUS
INTRAVENOUS | Status: AC
  Filled 2019-01-28: qty 20

## 2019-01-28 MED ORDER — OXYCODONE HCL 5 MG/5ML PO SOLN
5.0000 mg | Freq: Once | ORAL | Status: DC | PRN
  Filled 2019-01-28: qty 5

## 2019-01-28 MED ORDER — EPHEDRINE SULFATE 50 MG/ML IJ SOLN
INTRAMUSCULAR | Status: DC | PRN
  Administered 2019-01-28 (×4): 10 mg via INTRAVENOUS

## 2019-01-28 MED ORDER — CIPROFLOXACIN IN D5W 400 MG/200ML IV SOLN
INTRAVENOUS | Status: AC
  Filled 2019-01-28: qty 200

## 2019-01-28 MED ORDER — ACETAMINOPHEN 160 MG/5ML PO SOLN
325.0000 mg | ORAL | Status: DC | PRN
  Filled 2019-01-28: qty 20.3

## 2019-01-28 MED ORDER — MIDAZOLAM HCL 5 MG/5ML IJ SOLN
INTRAMUSCULAR | Status: DC | PRN
  Administered 2019-01-28: 2 mg via INTRAVENOUS

## 2019-01-28 MED ORDER — GLYCOPYRROLATE PF 0.2 MG/ML IJ SOSY
PREFILLED_SYRINGE | INTRAMUSCULAR | Status: AC
  Filled 2019-01-28: qty 1

## 2019-01-28 MED ORDER — FLEET ENEMA 7-19 GM/118ML RE ENEM
1.0000 | ENEMA | Freq: Once | RECTAL | Status: DC
  Filled 2019-01-28: qty 1

## 2019-01-28 MED ORDER — ONDANSETRON HCL 4 MG/2ML IJ SOLN
INTRAMUSCULAR | Status: DC | PRN
  Administered 2019-01-28: 4 mg via INTRAVENOUS

## 2019-01-28 MED ORDER — ACETAMINOPHEN 325 MG PO TABS
325.0000 mg | ORAL_TABLET | ORAL | Status: DC | PRN
  Filled 2019-01-28: qty 2

## 2019-01-28 MED ORDER — DEXAMETHASONE SODIUM PHOSPHATE 10 MG/ML IJ SOLN
INTRAMUSCULAR | Status: AC
  Filled 2019-01-28: qty 1

## 2019-01-28 MED ORDER — LIDOCAINE 2% (20 MG/ML) 5 ML SYRINGE
INTRAMUSCULAR | Status: AC
  Filled 2019-01-28: qty 5

## 2019-01-28 MED ORDER — SODIUM CHLORIDE 0.9 % IR SOLN
Status: DC | PRN
  Administered 2019-01-28: 500 mL

## 2019-01-28 MED ORDER — FENTANYL CITRATE (PF) 100 MCG/2ML IJ SOLN
INTRAMUSCULAR | Status: DC | PRN
  Administered 2019-01-28: 25 ug via INTRAVENOUS
  Administered 2019-01-28 (×2): 50 ug via INTRAVENOUS

## 2019-01-28 MED ORDER — CIPROFLOXACIN IN D5W 400 MG/200ML IV SOLN
400.0000 mg | INTRAVENOUS | Status: AC
  Administered 2019-01-28: 12:00:00 400 mg via INTRAVENOUS
  Filled 2019-01-28: qty 200

## 2019-01-28 SURGICAL SUPPLY — 33 items
BAG URINE DRAIN 2000ML AR STRL (UROLOGICAL SUPPLIES) ×3 IMPLANT
BLADE CLIPPER SENSICLIP SURGIC (BLADE) ×6 IMPLANT
CATH FOLEY 2WAY SLVR  5CC 16FR (CATHETERS) ×2
CATH FOLEY 2WAY SLVR 5CC 16FR (CATHETERS) ×1 IMPLANT
CATH ROBINSON RED A/P 16FR (CATHETERS) IMPLANT
CATH ROBINSON RED A/P 20FR (CATHETERS) ×3 IMPLANT
CLOTH BEACON ORANGE TIMEOUT ST (SAFETY) ×3 IMPLANT
CONT SPEC 4OZ CLIKSEAL STRL BL (MISCELLANEOUS) ×6 IMPLANT
COVER BACK TABLE 60X90IN (DRAPES) ×3 IMPLANT
COVER MAYO STAND STRL (DRAPES) ×3 IMPLANT
DRSG TEGADERM 4X4.75 (GAUZE/BANDAGES/DRESSINGS) ×3 IMPLANT
DRSG TEGADERM 8X12 (GAUZE/BANDAGES/DRESSINGS) ×6 IMPLANT
GAUZE SPONGE 4X4 12PLY STRL LF (GAUZE/BANDAGES/DRESSINGS) ×3 IMPLANT
GLOVE BIO SURGEON STRL SZ7.5 (GLOVE) ×3 IMPLANT
GLOVE BIO SURGEON STRL SZ8 (GLOVE) IMPLANT
GLOVE SURG ORTHO 8.5 STRL (GLOVE) ×9 IMPLANT
GLOVE SURG SS PI 6.5 STRL IVOR (GLOVE) IMPLANT
GOWN STRL REUS W/TWL LRG LVL3 (GOWN DISPOSABLE) ×6 IMPLANT
GOWN STRL REUS W/TWL XL LVL3 (GOWN DISPOSABLE) ×6 IMPLANT
HOLDER FOLEY CATH W/STRAP (MISCELLANEOUS) IMPLANT
IMPL SPACEOAR SYSTEM 10ML (Spacer) ×1 IMPLANT
IMPLANT SPACEOAR SYSTEM 10ML (Spacer) ×3 IMPLANT
IV SOD CHL 0.9% 1000ML (IV SOLUTION) ×3 IMPLANT
KIT TURNOVER CYSTO (KITS) ×3 IMPLANT
MARKER SKIN DUAL TIP RULER LAB (MISCELLANEOUS) ×3 IMPLANT
PACK CYSTO (CUSTOM PROCEDURE TRAY) ×3 IMPLANT
Radioactive seed ×186 IMPLANT
SURGILUBE 2OZ TUBE FLIPTOP (MISCELLANEOUS) ×3 IMPLANT
SUT BONE WAX W31G (SUTURE) IMPLANT
SYR 10ML LL (SYRINGE) ×3 IMPLANT
TOWEL OR 17X26 10 PK STRL BLUE (TOWEL DISPOSABLE) ×3 IMPLANT
UNDERPAD 30X30 (UNDERPADS AND DIAPERS) ×6 IMPLANT
WATER STERILE IRR 500ML POUR (IV SOLUTION) ×3 IMPLANT

## 2019-01-28 NOTE — Anesthesia Postprocedure Evaluation (Signed)
Anesthesia Post Note  Patient: Janeann Forehand Davern  Procedure(s) Performed: RADIOACTIVE SEED IMPLANT/BRACHYTHERAPY IMPLANT (N/A Prostate) SPACE OAR INSTILLATION (N/A Prostate)     Patient location during evaluation: PACU Anesthesia Type: General Level of consciousness: awake and alert and oriented Pain management: pain level controlled Vital Signs Assessment: post-procedure vital signs reviewed and stable Respiratory status: spontaneous breathing, nonlabored ventilation and respiratory function stable Cardiovascular status: blood pressure returned to baseline Postop Assessment: no apparent nausea or vomiting Anesthetic complications: no    Last Vitals:  Vitals:   01/28/19 1400 01/28/19 1415  BP: 114/80 124/89  Pulse: 80 75  Resp: 11 11  Temp:    SpO2: 92% 97%    Last Pain:  Vitals:   01/28/19 1400  TempSrc:   PainSc: [place]

## 2019-01-28 NOTE — H&P (Signed)
CC/HPI: Cc: Elevated PSA  HPI:  09/25/2018  53 year old male with an elevated age related PSA. He has a significant family history of prostate cancer, including in his father as well as his uncles. His father was diagnosed in his 51s. The patient's PSA has been slowly increasing. About 6 months ago it was 3.12 in about 2 months ago, PSA was further increased to 3.74. He does have some mild voiding complaints including frequency, difficulty postponing urination, nocturia 3, postvoid dribbling, intermittency, weaker stream.   12/06/2018  Patient is status post prostate biopsy. PSA 3.74. Prostate size 33.74. No abdominal surgeries. He denies erectile dysfunction. He does have some mild lower urinary tract symptoms and takes Flomax. BMI is 26.6.   Prostate biopsy revealed 4/12 cores positive for Gleason 3 + 3 adenocarcinoma of the prostate. Maximum involvement was 20%. He does have a strong family history of prostate cancer. He had a benign prostate exam. No nodules.     ALLERGIES: No Allergies    MEDICATIONS: Levaquin 750 mg tablet take 1 tablet morning of procedure  Tamsulosin Hcl 0.4 mg capsule 1 capsule PO Daily  Dulera 200 mcg-5 mcg/actuation hfa aerosol with adapter Inhalation  Vitamin B12     GU PSH: Prostate Needle Biopsy - 11/29/2018       PSH Notes: Ankle Surgery   NON-GU PSH: Surgical Pathology, Gross And Microscopic Examination For Prostate Needle - 11/29/2018     GU PMH: BPH w/LUTS - 09/25/2018 Elevated PSA - 09/25/2018 Nocturia - 09/25/2018 Urinary Frequency - 09/25/2018 Weak Urinary Stream - 09/25/2018 Other microscopic hematuria, Microscopic hematuria - 2016    NON-GU PMH: Encounter for general adult medical examination without abnormal findings, Encounter for preventive health examination - 2016 Personal history of other diseases of the respiratory system, History of asthma - 2016 Asthma    FAMILY HISTORY: 1 Daughter - Other 1 son - Other 4 daughters - Other Hematuria -  Runs In Family Hypertension - Runs In Family Prostate Cancer - Runs In Family   SOCIAL HISTORY: Marital Status: Single Preferred Language: English; Race: Black or African American Current Smoking Status: Patient has never smoked.   Tobacco Use Assessment Completed: Used Tobacco in last 30 days? Does not drink caffeine.     Notes: Caffeine use, Occupation, Never a smoker, Divorced, Alcohol use, Four children   REVIEW OF SYSTEMS:    GU Review Male:   Patient denies frequent urination, hard to postpone urination, burning/ pain with urination, get up at night to urinate, leakage of urine, stream starts and stops, trouble starting your stream, have to strain to urinate , erection problems, and penile pain.  Gastrointestinal (Upper):   Patient denies nausea, vomiting, and indigestion/ heartburn.  Gastrointestinal (Lower):   Patient denies diarrhea and constipation.  Constitutional:   Patient denies fever, night sweats, weight loss, and fatigue.  Skin:   Patient denies skin rash/ lesion and itching.  Eyes:   Patient denies blurred vision and double vision.  Ears/ Nose/ Throat:   Patient denies sore throat and sinus problems.  Hematologic/Lymphatic:   Patient denies swollen glands and easy bruising.  Cardiovascular:   Patient denies leg swelling and chest pains.  Respiratory:   Patient denies cough and shortness of breath.  Endocrine:   Patient denies excessive thirst.  Musculoskeletal:   Patient denies back pain and joint pain.  Neurological:   Patient denies headaches and dizziness.  Psychologic:   Patient denies depression and anxiety.   VITAL SIGNS: None   MULTI-SYSTEM  PHYSICAL EXAMINATION:    Constitutional: Well-nourished. No physical deformities. Normally developed. Good grooming.  Respiratory: No labored breathing, no use of accessory muscles.   Cardiovascular: Normal temperature, adequate perfusion of extremities  Skin: No paleness, no jaundice  Neurologic / Psychiatric: Oriented  to time, oriented to place, oriented to person. No depression, no anxiety, no agitation.  Gastrointestinal: No mass, no tenderness, no rigidity, non obese abdomen. No obvious abdominal surgeries  Eyes: Normal conjunctivae. Normal eyelids.  Musculoskeletal: Normal gait and station of head and neck.     PAST DATA REVIEWED:  Source Of History:  Patient  Records Review:   Pathology Reports, Previous Patient Records   PROCEDURES:          Urinalysis w/Scope Dipstick Dipstick Cont'd Micro  Color: Yellow Bilirubin: Neg mg/dL WBC/hpf: 0 - 5/hpf  Appearance: Clear Ketones: Neg mg/dL RBC/hpf: 0 - 2/hpf  Specific Gravity: 1.030 Blood: Trace ery/uL Bacteria: Few (10-25/hpf)  pH: 6.0 Protein: Neg mg/dL Cystals: Ca Oxalate  Glucose: Neg mg/dL Urobilinogen: 1.0 mg/dL Casts: NS (Not Seen)    Nitrites: Neg Trichomonas: Not Present    Leukocyte Esterase: Neg leu/uL Mucous: Present      Epithelial Cells: 0 - 5/hpf      Yeast: NS (Not Seen)      Sperm: Not Present    ASSESSMENT:      ICD-10 Details  1 GU:   Prostate Cancer - C61    PLAN:           Schedule Return Visit/Planned Activity: 1 Month - Follow up MD  Return Visit/Planned Activity: ASAP - Consult Refer to physician - Sheral Apley. Tammi Klippel, MD          Document Letter(s):  Created for Patient: Clinical Summary         Notes:   I went over his pathology report with him today as well as the significance of his Gleason score, number and location of cores positive and percent of cores positive.  The patient was counseled about the natural history of prostate cancer and the standard treatment options that are available for prostate cancer. It was explained to him how his age and life expectancy, clinical stage, Gleason score, and PSA affect his prognosis, the decision to proceed with additional staging studies, as well as how that information influences recommended treatment strategies. We discussed the roles for active surveillance, radiation  therapy, surgical therapy, androgen deprivation, as well as ablative therapy options for the treatment of prostate cancer as appropriate to his individual cancer situation. We discussed the risks and benefits of these options with regard to their impact on cancer control and also in terms of potential adverse events, complications, and impact on quiality of life particularly related to urinary, bowel, and sexual function. The patient was encouraged to ask questions throughout the discussion today and all questions were answered to his stated satisfaction. In addition, the patient was provided with and/or directed to appropriate resources and literature for further education about prostate cancer and treatment options.    He elects for brachytherapy with spaceoar

## 2019-01-28 NOTE — Op Note (Signed)
PATIENT:  Raymond Torres  PRE-OPERATIVE DIAGNOSIS:  Adenocarcinoma of the prostate  POST-OPERATIVE DIAGNOSIS:  Same  PROCEDURE:  1. I-125 radioactive seed implantation 2. Cystoscopy  3. Placement of SpaceOAR  SURGEON:  Surgeon(s): Wendie Simmer, MD  Radiation oncologist: Dr. Tyler Pita  ANESTHESIA:  General  EBL:  Minimal  DRAINS: 59 French Foley catheter  INDICATION: Raymond Torres  Description of procedure: After informed consent the patient was brought to the major OR, placed on the table and administered general anesthesia. He was then moved to the modified lithotomy position with his perineum perpendicular to the floor. His perineum and genitalia were then sterilely prepped. An official timeout was then performed. A 16 French Foley catheter was then placed in the bladder and filled with dilute contrast, a rectal tube was placed in the rectum and the transrectal ultrasound probe was placed in the rectum and affixed to the stand. He was then sterilely draped.  Real time ultrasonography was used along with the seed planning software [employer] Prostate. This was used to develop the seed plan including the number of needles as well as number of seeds required for complete and adequate coverage. Real-time ultrasonography was then used along with the previously developed plan and the Nucletron device to implant a total of 62 seeds using 22 needles. This proceeded without difficulty or complication.   I then proceeded with placement of SpaceOARby introducing a needle with the bevel angled inferiorly approximately 2 cm superior to the anus. This was angled downward and under direct ultrasound was placed within the space between the prostatic capsule and rectum. This was confirmed with a small amount of sterile saline injected and this was performed under direct ultrasound. I then attached the SpaceOARto the needle and injected this in the space between the prostate and rectum with  good placement noted.  A Foley catheter was then removed as well as the transrectal ultrasound probe and rectal probe. Flexible cystoscopy was then performed using the 17 French flexible scope which revealed a normal urethra throughout its length down to the sphincter which appeared intact. The prostatic urethra revealed bilobar hypertrophy but no evidence of obstruction, seeds, spacers or lesions. The bladder was then entered and fully and systematically inspected. The ureteral orifices were noted to be of normal configuration and position. The mucosa revealed no evidence of tumors. There were also no stones identified within the bladder. I noted no seeds or spacers on the floor of the bladder and retroflexion of the scope revealed no seeds protruding from the base of the prostate.  The cystoscope was then removed and the patient was awakened and taken to recovery room in stable and satisfactory condition. He tolerated procedure well and there were no intraoperative complications.

## 2019-01-28 NOTE — Anesthesia Preprocedure Evaluation (Signed)
Anesthesia Evaluation  Patient identified by MRN, date of birth, ID band Patient awake    Reviewed: Allergy & Precautions, NPO status , Patient's Chart, lab work & pertinent test results  Airway Mallampati: I       Dental no notable dental hx. (+) Teeth Intact   Pulmonary    Pulmonary exam normal breath sounds clear to auscultation       Cardiovascular Normal cardiovascular exam Rhythm:Regular Rate:Normal     Neuro/Psych negative neurological ROS  negative psych ROS   GI/Hepatic negative GI ROS, Neg liver ROS,   Endo/Other  negative endocrine ROS  Renal/GU negative Renal ROS     Musculoskeletal negative musculoskeletal ROS (+)   Abdominal Normal abdominal exam  (+)   Peds  Hematology  (+) anemia ,   Anesthesia Other Findings   Reproductive/Obstetrics                             Anesthesia Physical Anesthesia Plan  ASA: II  Anesthesia Plan: General   Post-op Pain Management:    Induction: Intravenous  PONV Risk Score and Plan: 4 or greater and Ondansetron, Dexamethasone and Midazolam  Airway Management Planned: LMA  Additional Equipment: None  Intra-op Plan:   Post-operative Plan: Extubation in OR  Informed Consent: I have reviewed the patients History and Physical, chart, labs and discussed the procedure including the risks, benefits and alternatives for the proposed anesthesia with the patient or authorized representative who has indicated his/her understanding and acceptance.     Dental advisory given  Plan Discussed with: CRNA  Anesthesia Plan Comments:         Anesthesia Quick Evaluation

## 2019-01-28 NOTE — Addendum Note (Signed)
Addendum  created 01/28/19 1536 by Bufford Spikes, CRNA   Charge Capture section accepted

## 2019-01-28 NOTE — Interval H&P Note (Signed)
History and Physical Interval Note:  01/28/2019 11:40 AM  Raymond Torres  has presented today for surgery, with the diagnosis of PROSTATE CANCER.  The various methods of treatment have been discussed with the patient and family. After consideration of risks, benefits and other options for treatment, the patient has consented to  Procedure(s): RADIOACTIVE SEED IMPLANT/BRACHYTHERAPY IMPLANT (N/A) SPACE OAR INSTILLATION (N/A) as a surgical intervention.  The patient's history has been reviewed, patient examined, no change in status, stable for surgery.  I have reviewed the patient's chart and labs.  Questions were answered to the patient's satisfaction.     Raymond Torres

## 2019-01-28 NOTE — Discharge Instructions (Addendum)
Antibiotics °You may be given a prescription for an antibiotic to take when you arrive home. If so, be sure to take every tablet in the bottle, even if you are feeling better before the prescription is finished. If you begin itching, notice a rash or start to swell on your trunk, arms, legs and/or throat, immediately stop taking the antibiotic and call your Urologist. °Diet °Resume your usual diet when you return home. To keep your bowels moving easily and softly, drink prune, apple and cranberry juice at room temperature. You may also take a stool softener, such as Colace, which is available without prescription at local pharmacies. °Daily activities °? No driving or heavy lifting for at least two days after the implant. °? No bike riding, horseback riding or riding lawn mowers for the first month after the implant. °? Any strenuous physical activity should be approved by your doctor before you resume it. °Sexual relations °You may resume sexual relations two weeks after the procedure. A condom should be used for the first two weeks. Your semen may be dark brown or black; this is normal and is related bleeding that may have occurred during the implant. °Postoperative swelling °Expect swelling and bruising of the scrotum and perineum (the area between the scrotum and anus). Both the swelling and the bruising should resolve in l or 2 weeks. Ice packs and over- the-counter medications such as Tylenol, Advil or Aleve may lessen your discomfort. °Postoperative urination °Most men experience burning on urination and/or urinary frequency. If this becomes bothersome, contact your Urologist.  Medication can be prescribed to relieve these problems.  It is normal to have some blood in your urine for a few days after the implant. °Special instructions related to the seeds °It is unlikely that you will pass an Iodine-125 seed in your urine. The seeds are silver in color and are about as large as a grain of rice. If you pass a  seed, do not handle it with your fingers. Use a spoon to place it in an envelope or jar in place this in base occluded area such as the garage or basement for return to the radiation clinic at your convenience. ° °Contact your doctor for °? Temperature greater than 101 F °? Increasing pain °? Inability to urinate °Follow-up ° You should have follow up with your urologist and radiation oncologist about 3 weeks after the procedure. °General information regarding Iodine seeds °? Iodine-125 is a low energy radioactive material. It is not deeply penetrating and loses energy at short distances. Your prostate will absorb the radiation. Objects that are touched or used by the patient do not become radioactive. °? Body wastes (urine and stool) or body fluids (saliva, tears, semen or blood) are not radioactive. °? The Nuclear Regulatory Commission (NRC) has determined that no radiation precautions are needed for patients undergoing Iodine-125 seed implantation. The NRC states that such patients do not present a risk to the people around them, including young children and pregnant women. However, in keeping with the general principle that radiation exposure should be kept as low reasonably possible, we suggest the following: °? Children and pets should not sit on the patient's lap for the first two (2) weeks after the implant. °? Pregnant (or possibly pregnant) women should avoid prolonged, close contact with the patient for the first two (2) weeks after the implant. °? A distance of three (3) feet is acceptable. °? At a distance of three (3) feet, there is no limit to the   length of time anyone can be with the patient.  CYSTOSCOPY HOME CARE INSTRUCTIONS  Activity: Rest for the remainder of the day.  Do not drive or operate equipment today.  You may resume normal activities in one to two days as instructed by your physician.   Meals: Drink plenty of liquids and eat light foods such as gelatin or soup this evening.  You  may return to a normal meal plan tomorrow.  Return to Work: You may return to work in one to two days or as instructed by your physician.  Special Instructions / Symptoms: Call your physician if any of these symptoms occur:   -persistent or heavy bleeding  -bleeding which continues after first few urination  -large blood clots that are difficult to pass  -urine stream diminishes or stops completely  -fever equal to or higher than 101 degrees Farenheit.  -cloudy urine with a strong, foul odor  -severe pain  Females should always wipe from front to back after elimination.  You may feel some burning pain when you urinate.  This should disappear with time.  Applying moist heat to the lower abdomen or a hot tub bath may help relieve the pain. \  Follow-Up / Date of Return Visit to Your Physician:  As instructed Post Anesthesia Home Care Instructions  Activity: Get plenty of rest for the remainder of the day. A responsible individual must stay with you for 24 hours following the procedure.  For the next 24 hours, DO NOT: -Drive a car -Paediatric nurse -Drink alcoholic beverages -Take any medication unless instructed by your physician -Make any legal decisions or sign important papers.  Meals: Start with liquid foods such as gelatin or soup. Progress to regular foods as tolerated. Avoid greasy, spicy, heavy foods. If nausea and/or vomiting occur, drink only clear liquids until the nausea and/or vomiting subsides. Call your physician if vomiting continues.  Special Instructions/Symptoms: Your throat may feel dry or sore from the anesthesia or the breathing tube placed in your throat during surgery. If this causes discomfort, gargle with warm salt water. The discomfort should disappear within 24 hours.  If you had a scopolamine patch placed behind your ear for the management of post- operative nausea and/or vomiting:  1. The medication in the patch is effective for 72 hours, after which  it should be removed.  Wrap patch in a tissue and discard in the trash. Wash hands thoroughly with soap and water. 2. You may remove the patch earlier than 72 hours if you experience unpleasant side effects which may include dry mouth, dizziness or visual disturbances. 3. Avoid touching the patch. Wash your hands with soap and water after contact with the patch.     Call for an appointment to arrange follow-up.  Patient Signature:  ________________________________________________________  Nurse's Signature:  ________________________________________________________

## 2019-01-28 NOTE — Anesthesia Procedure Notes (Signed)
Procedure Name: LMA Insertion Date/Time: 01/28/2019 11:49 AM Performed by: Bufford Spikes, CRNA Pre-anesthesia Checklist: Patient identified, Emergency Drugs available, Suction available and Patient being monitored Patient Re-evaluated:Patient Re-evaluated prior to induction Oxygen Delivery Method: Circle system utilized Preoxygenation: Pre-oxygenation with 100% oxygen Induction Type: IV induction Ventilation: Mask ventilation without difficulty LMA: LMA inserted LMA Size: 4.0 Number of attempts: 1 Airway Equipment and Method: Bite block Placement Confirmation: positive ETCO2 Tube secured with: Tape Dental Injury: Teeth and Oropharynx as per pre-operative assessment

## 2019-01-28 NOTE — Transfer of Care (Signed)
Immediate Anesthesia Transfer of Care Note  Patient: Raymond Torres  Procedure(s) Performed: RADIOACTIVE SEED IMPLANT/BRACHYTHERAPY IMPLANT (N/A Prostate) SPACE OAR INSTILLATION (N/A Prostate)  Patient Location: PACU  Anesthesia Type:General  Level of Consciousness: awake, alert  and oriented  Airway & Oxygen Therapy: Patient Spontanous Breathing and Patient connected to nasal cannula oxygen  Post-op Assessment: Report given to RN and Post -op Vital signs reviewed and stable  Post vital signs: Reviewed and stable  Last Vitals:  Vitals Value Taken Time  BP 128/81 01/28/19 1319  Temp    Pulse 95 01/28/19 1322  Resp 14 01/28/19 1322  SpO2 100 % 01/28/19 1322  Vitals shown include unvalidated device data.  Last Pain:  Vitals:   01/28/19 1016  TempSrc: Oral  PainSc: 0-No pain      Patients Stated Pain Goal: 4 ([phone])  Complications: No apparent anesthesia complications

## 2019-01-29 ENCOUNTER — Encounter (HOSPITAL_BASED_OUTPATIENT_CLINIC_OR_DEPARTMENT_OTHER): Payer: Self-pay | Admitting: Urology

## 2019-02-02 NOTE — Progress Notes (Signed)
   Radiation Oncology         [phone] ________________________________  Name: Raymond Torres MRN: [id number]  Date: 02/02/2019  DOB: 06/05/1965       Prostate Seed Implant  [id number], Alinda Sierras, MD  No ref. provider found  DIAGNOSIS: 53 y.o. gentleman with Stage T1c adenocarcinoma of the prostate with Gleason score of 3+3, and PSA of 3.74.    ICD-10-CM   1. Prostate cancer ([place])  C61 DG Chest 2 View    DG Chest 2 View    PROCEDURE: Insertion of radioactive I-125 seeds into the prostate gland.  RADIATION DOSE: 145 Gy, definitive therapy.  TECHNIQUE: Raymond Torres was brought to the operating room with the urologist. He was placed in the dorsolithotomy position. He was catheterized and a rectal tube was inserted. The perineum was shaved, prepped and draped. The ultrasound probe was then introduced into the rectum to see the prostate gland.  TREATMENT DEVICE: A needle grid was attached to the ultrasound probe stand and anchor needles were placed.  3D PLANNING: The prostate was imaged in 3D using a sagittal sweep of the prostate probe. These images were transferred to the planning computer. There, the prostate, urethra and rectum were defined on each axial reconstructed image. Then, the software created an optimized 3D plan and a few seed positions were adjusted. The quality of the plan was reviewed using [facility] information for the target and the following two organs at risk:  Urethra and Rectum.  Then the accepted plan was printed and handed off to the radiation therapist.  Under my supervision, the custom loading of the seeds and spacers was carried out and loaded into sealed vicryl sleeves.  These pre-loaded needles were then placed into the needle holder.Marland Kitchen  PROSTATE VOLUME STUDY:  Using transrectal ultrasound the volume of the prostate was verified to be 30.2 cc.  SPECIAL TREATMENT PROCEDURE/SUPERVISION AND HANDLING: The pre-loaded needles were then delivered under sagittal  guidance. A total of 22 needles were used to deposit 62 seeds in the prostate gland. The individual seed activity was 0.429 mCi.  SpaceOAR:  Yes  COMPLEX SIMULATION: At the end of the procedure, an anterior radiograph of the pelvis was obtained to document seed positioning and count. Cystoscopy was performed to check the urethra and bladder.  MICRODOSIMETRY: At the end of the procedure, the patient was emitting 0.104 mR/hr at 1 meter. Accordingly, he was considered safe for hospital discharge.  PLAN: The patient will return to the radiation oncology clinic for post implant CT dosimetry in three weeks.   ________________________________  Sheral Apley Tammi Klippel, M.D.

## 2019-02-12 ENCOUNTER — Telehealth: Payer: Self-pay | Admitting: *Deleted

## 2019-02-12 NOTE — Telephone Encounter (Signed)
CALLED PATIENT TO INFORM NOT TO COME TOMORROW, DUE TO MRI NOT BEING UNTIL 02-18-19, I TOLD HIM THAT I WOULD CALL HIM TOMORROW TO RESCHEDULE ONCE I HAVE SPOKEN WITH THE PA Raymond Torres, PATIENT VERIFIED UNDERSTANDING THIS

## 2019-02-13 ENCOUNTER — Ambulatory Visit: Admission: RE | Admit: 2019-02-13 | Payer: BC Managed Care – PPO | Source: Ambulatory Visit | Admitting: Urology

## 2019-02-13 ENCOUNTER — Telehealth: Payer: Self-pay | Admitting: *Deleted

## 2019-02-13 ENCOUNTER — Ambulatory Visit: Payer: BC Managed Care – PPO | Admitting: Radiation Oncology

## 2019-02-13 NOTE — Telephone Encounter (Signed)
CALLED PATIENT TO INFORM OF POST SEED APPTS. FOR 02-19-19, SPOKE WITH PATIENT AND HE IS AWARE OF THESE APPTS.

## 2019-02-18 ENCOUNTER — Telehealth: Payer: Self-pay | Admitting: *Deleted

## 2019-02-18 ENCOUNTER — Other Ambulatory Visit: Payer: Self-pay

## 2019-02-18 ENCOUNTER — Ambulatory Visit (HOSPITAL_COMMUNITY)
Admission: RE | Admit: 2019-02-18 | Discharge: 2019-02-18 | Disposition: A | Discharge status: Home / Self Care | Payer: BC Managed Care – PPO | Source: Ambulatory Visit | Attending: Urology | Admitting: Urology

## 2019-02-18 DIAGNOSIS — C61 Malignant neoplasm of prostate: Secondary | ICD-10-CM | POA: Insufficient documentation

## 2019-02-18 NOTE — Telephone Encounter (Signed)
Called patient to remind of post seed appts. for 02-19-19, spoke with patient and he is aware of these appts.

## 2019-02-19 ENCOUNTER — Other Ambulatory Visit: Payer: Self-pay | Admitting: Urology

## 2019-02-19 ENCOUNTER — Ambulatory Visit
Admission: RE | Admit: 2019-02-19 | Discharge: 2019-02-19 | Disposition: A | Discharge status: Home / Self Care | Payer: BC Managed Care – PPO | Source: Ambulatory Visit | Attending: Radiation Oncology | Admitting: Radiation Oncology

## 2019-02-19 ENCOUNTER — Other Ambulatory Visit: Payer: Self-pay

## 2019-02-19 ENCOUNTER — Encounter: Payer: Self-pay | Admitting: Urology

## 2019-02-19 ENCOUNTER — Ambulatory Visit
Admission: RE | Admit: 2019-02-19 | Discharge: 2019-02-19 | Disposition: A | Discharge status: Home / Self Care | Payer: BC Managed Care – PPO | Source: Ambulatory Visit | Attending: Urology | Admitting: Urology

## 2019-02-19 VITALS — BP 111/88 | HR 78 | Temp 98.6°F | Resp 18 | Wt 184.6 lb

## 2019-02-19 DIAGNOSIS — R3911 Hesitancy of micturition: Secondary | ICD-10-CM | POA: Diagnosis not present

## 2019-02-19 DIAGNOSIS — R350 Frequency of micturition: Secondary | ICD-10-CM | POA: Insufficient documentation

## 2019-02-19 DIAGNOSIS — C61 Malignant neoplasm of prostate: Secondary | ICD-10-CM | POA: Insufficient documentation

## 2019-02-19 DIAGNOSIS — Z923 Personal history of irradiation: Secondary | ICD-10-CM | POA: Diagnosis not present

## 2019-02-19 DIAGNOSIS — Z7951 Long term (current) use of inhaled steroids: Secondary | ICD-10-CM | POA: Insufficient documentation

## 2019-02-19 MED ORDER — TAMSULOSIN HCL 0.4 MG PO CAPS: 0.4000 mg | ORAL_CAPSULE | Freq: Every day | ORAL | 2 refills | Status: DC

## 2019-02-19 NOTE — Progress Notes (Signed)
Vitals stable. Denies pain. IPSS 28. Denies dysuria but does report an occasional tingle. Denies hematuria. Reports voiding every hour during the day. Reports urinary urgency. Reports occasional leakage associated with urinary urgency. Patient had MRI yesterday to confirm spaceoar placement. Presently patient isn't scheduled to follow up with his urologist.

## 2019-02-19 NOTE — Progress Notes (Signed)
Radiation Oncology         [phone] ________________________________  Name: MELBERN CEFALO MRN: [id number]  Date: 02/19/2019  DOB: 10-Apr-1965  Post-Seed Follow-Up Visit Note  CC: Eulas Post, MD  Eulas Post, MD  Diagnosis:   53 y.o. gentleman with Stage T1c adenocarcinoma of the prostate with Gleason score of 3+3, and PSA of 3.74.    ICD-10-CM   1. Malignant neoplasm of prostate ([place])  C61     Interval Since Last Radiation:  3 weeks 01/28/19:  Insertion of radioactive I-125 seeds into the prostate gland; 145 Gy, definitive/boost therapy with placement of SpaceOAR gel.  Narrative:  The patient returns today for routine follow-up.  He is complaining of increased urinary frequency and urinary hesitation symptoms. He filled out a questionnaire regarding urinary function today providing and overall IPSS score of 28 characterizing his symptoms as severe with weak flow of stream and increased frequency and urgency, voiding approximately every hour day and night.  He specifically denies dysuria, gross hematuria, incomplete bladder emptying or incontinence.  His pre-implant score was 15. He denies any abdominal pain or bowel symptoms.  He reports a healthy appetite and has not noticed any significant decrease in his energy level.  Overall, he is pleased with his progress to date.  ALLERGIES:  is allergic to shellfish allergy.  Meds: Current Outpatient Medications  Medication Sig Dispense Refill  . mometasone-formoterol (DULERA) 100-5 MCG/ACT AERO Inhale 2 puffs into the lungs 2 (two) times daily. (Patient not taking: Reported on 02/19/2019) 13 g 11  . tamsulosin (FLOMAX) 0.4 MG CAPS capsule Take 1 capsule (0.4 mg total) by mouth daily. 30 capsule 2   No current facility-administered medications for this encounter.     Physical Findings: In general this is a well appearing African-American male in no acute distress. He's alert and oriented x4 and appropriate throughout the  examination. Cardiopulmonary assessment is negative for acute distress and he exhibits normal effort.   Lab Findings: Lab Results  Component Value Date   WBC 5.2 01/24/2019   HGB 12.9 (L) 01/24/2019   HCT 40.3 01/24/2019   MCV 90.8 01/24/2019   PLT 273 01/24/2019    Radiographic Findings:  Patient underwent CT imaging in our clinic for post implant dosimetry. The CT will be reviewed by Dr. Tammi Klippel to confirm there is an adequate distribution of radioactive seeds throughout the prostate gland and ensure that there are no seeds in or near the rectum. He had a prostate MRI on 02/18/2019 and those images will be fused with his CT images for further evaluation. We suspect the final radiation plan and dosimetry will show appropriate coverage of the prostate gland. He understands that we will call and inform him of any unexpected findings on further review of his imaging and dosimetry.  Impression/Plan: 53 y.o. gentleman with Stage T1c adenocarcinoma of the prostate with Gleason score of 3+3, and PSA of 3.74. The patient is recovering from the effects of radiation. His urinary symptoms should gradually improve over the next 4-6 months. We talked about this today. He is encouraged by his improvement already and is otherwise pleased with his outcome.  I have sent a prescription for Flomax which he will start taking daily beginning today to see if this will improve his LUTS. We also talked about long-term follow-up for prostate cancer following seed implant. He understands that ongoing PSA determinations and digital rectal exams will help perform surveillance to rule out disease recurrence. He does not  currently have a follow up appointment scheduled with Bell so I have advised him to call [facility] to make arrangements for follow-up visit in the next 2 to 3 weeks if he has not heard from their office in the next 1 to 2 days. He understands what to expect with his PSA measures. Patient was also educated  today about some of the long-term effects from radiation including a small risk for rectal bleeding and possibly erectile dysfunction. We talked about some of the general management approaches to these potential complications. However, I did encourage the patient to contact our office or return at any point if he has questions or concerns related to his previous radiation and prostate cancer.    Nicholos Johns, PA-C

## 2019-02-21 NOTE — Progress Notes (Signed)
  Radiation Oncology         [phone] ________________________________  Name: NEKHI OEN MRN: [id number]  Date: 02/19/2019  DOB: [birth year]  COMPLEX SIMULATION NOTE  NARRATIVE:  The patient was brought to the [facility] today following prostate seed implantation approximately one month ago.  Identity was confirmed.  All relevant records and images related to the planned course of therapy were reviewed.  Then, the patient was set-up supine.  CT images were obtained.  The CT images were loaded into the planning software.  Then the prostate and rectum were contoured.  Treatment planning then occurred.  The implanted iodine 125 seeds were identified by the physics staff for projection of radiation distribution  I have requested : 3D Simulation  I have requested a DVH of the following structures: Prostate and rectum.    ________________________________  Sheral Apley Tammi Klippel, M.D.

## 2019-02-22 ENCOUNTER — Encounter: Payer: Self-pay | Admitting: Medical Oncology

## 2019-02-25 DIAGNOSIS — C61 Malignant neoplasm of prostate: Secondary | ICD-10-CM | POA: Diagnosis not present

## 2019-04-08 ENCOUNTER — Encounter: Payer: Self-pay | Admitting: Family Medicine

## 2019-04-08 ENCOUNTER — Telehealth: Payer: Self-pay

## 2019-04-08 ENCOUNTER — Other Ambulatory Visit: Payer: Self-pay

## 2019-04-08 ENCOUNTER — Telehealth (INDEPENDENT_AMBULATORY_CARE_PROVIDER_SITE_OTHER): Payer: BC Managed Care – PPO | Admitting: Family Medicine

## 2019-04-08 DIAGNOSIS — R350 Frequency of micturition: Secondary | ICD-10-CM | POA: Diagnosis not present

## 2019-04-08 DIAGNOSIS — R5383 Other fatigue: Secondary | ICD-10-CM | POA: Diagnosis not present

## 2019-04-08 NOTE — Telephone Encounter (Signed)
Patient sent a [employer] message stating that he does not feel well and is asking for a note to take some time off of work.   I called patient and left a detailed voice message for patient to call back to schedule a virtual visit with Dr. Elease Hashimoto.  OK for [facility] to discuss, advise, and schedule this patient for a virtual visit.  CRM Created.

## 2019-04-08 NOTE — Telephone Encounter (Signed)
Patient scheduled virtual visit

## 2019-04-08 NOTE — Progress Notes (Signed)
This visit type was conducted due to national recommendations for restrictions regarding the COVID-19 pandemic in an effort to limit this patient's exposure and mitigate transmission in our community.   Virtual Visit via Video Note  I connected with Raymond Torres on 04/08/19 at  4:30 PM EST by a video enabled telemedicine application and verified that I am speaking with the correct person using two identifiers.  Location patient: home Location provider:work or home office Persons participating in the virtual visit: patient, provider  I discussed the limitations of evaluation and management by telemedicine and the availability of in person appointments. The patient expressed understanding and agreed to proceed.   HPI: Raymond Torres generally not been feeling well for the past several weeks.  He was diagnosed this past year with adenocarcinoma the prostate and had surgery November 9 for radioactive seed implantation.  He states since that time he has had some urine frequency.  He was placed back on Flomax after surgery.  He is getting up almost every hour.  He does have some increased thirst.  No past history of diabetes.  No burning with urination.  No gross hematuria.  No fevers or chills.  He has had some general fatigue.  No respiratory symptoms.  No diarrhea.  No known sick contacts.   ROS: See pertinent positives and negatives per HPI.  Past Medical History:  Diagnosis Date  . Allergic rhinitis   . Asthma    followed by pcp  . Frequency of urination   . History of Helicobacter pylori infection   . History of non-ST elevation myocardial infarction (NSTEMI) (01-25-2019  per pt has never any cardiac s&s since 2007)   09-13-2005 thought to be coronary spasm;  per cardiac cath normal coronary arteries, ef 60%, no wall motion abnormality  . Hyperlipidemia   . Hyperplasia of prostate with lower urinary tract symptoms (LUTS)   . Nocturia   . Prostate cancer [facility]) urologist--- dr bell/  oncologist---  dr Tammi Klippel   dx 11-29-2018--- Stage T1c,  Gleason 3+3  . Wears glasses     Past Surgical History:  Procedure Laterality Date  . ACHILLES TENDON REPAIR Left 08/2009   Dr Marlou Sa  . CARDIAC CATHETERIZATION  09-13-2005 & 01-04-2006  dr g. taylor   angiographically normal coronary arteries,  ef 60%,  no wall motion abnormality, LVEDP 6 mmHg  . PROSTATE BIOPSY  11-20-2018  dr bell office  . RADIOACTIVE SEED IMPLANT N/A 01/28/2019   Procedure: RADIOACTIVE SEED IMPLANT/BRACHYTHERAPY IMPLANT;  Surgeon: Lucas Mallow, MD;  [facility];  Service: Urology;  Laterality: N/A;  . SPACE OAR INSTILLATION N/A 01/28/2019   Procedure: SPACE OAR INSTILLATION;  Surgeon: Lucas Mallow, MD;  Location: [facility];  Service: Urology;  Laterality: N/A;    Family History  Problem Relation Age of Onset  . Hypertension Mother   . Cancer Father        lung and prostate  . Prostate cancer Father   . Prostate cancer Maternal Uncle   . Prostate cancer Paternal Uncle   . Colon cancer Neg Hx     SOCIAL HX: Non-smoker   Current Outpatient Medications:  .  mometasone-formoterol (DULERA) 100-5 MCG/ACT AERO, Inhale 2 puffs into the lungs 2 (two) times daily. (Patient not taking: Reported on 02/19/2019), Disp: 13 g, Rfl: 11 .  tamsulosin (FLOMAX) 0.4 MG CAPS capsule, Take 1 capsule (0.4 mg total) by mouth daily., Disp: 30 capsule, Rfl: 2  EXAM:  VITALS per patient if applicable:  GENERAL: alert, oriented, appears well and in no acute distress  HEENT: atraumatic, conjunttiva clear, no obvious abnormalities on inspection of external nose and ears  NECK: normal movements of the head and neck  LUNGS: on inspection no signs of respiratory distress, breathing rate appears normal, no obvious gross SOB, gasping or wheezing  CV: no obvious cyanosis  MS: moves all visible extremities without noticeable abnormality  PSYCH/NEURO: pleasant and cooperative, no obvious  depression or anxiety, speech and thought processing grossly intact  ASSESSMENT AND PLAN:  Discussed the following assessment and plan:  Patient presents with several week history of nonspecific malaise and some urine frequency without burning.  Recent diagnosis of prostate cancer as above.  -We recommended scheduling office follow-up to further assess.  He needs some basic work-up such as urinalysis and probably some labs to assess his fatigue issues.     I discussed the assessment and treatment plan with the patient. The patient was provided an opportunity to ask questions and all were answered. The patient agreed with the plan and demonstrated an understanding of the instructions.   The patient was advised to call back or seek an in-person evaluation if the symptoms worsen or if the condition fails to improve as anticipated.    Carolann Littler, MD

## 2019-04-08 NOTE — Telephone Encounter (Signed)
Patient called in and attempted to make an immediate appointment. Attempted line 3x, no answer. Please advise and schedule patient for virtual visit.

## 2019-04-09 ENCOUNTER — Encounter: Payer: Self-pay | Admitting: Family Medicine

## 2019-04-09 ENCOUNTER — Other Ambulatory Visit: Payer: Self-pay

## 2019-04-09 ENCOUNTER — Ambulatory Visit (INDEPENDENT_AMBULATORY_CARE_PROVIDER_SITE_OTHER): Payer: BC Managed Care – PPO | Admitting: Family Medicine

## 2019-04-09 VITALS — BP 130/78 | HR 98 | Temp 98.0°F | Ht 67.0 in | Wt 186.3 lb

## 2019-04-09 DIAGNOSIS — C61 Malignant neoplasm of prostate: Secondary | ICD-10-CM | POA: Diagnosis not present

## 2019-04-09 DIAGNOSIS — R5383 Other fatigue: Secondary | ICD-10-CM | POA: Diagnosis not present

## 2019-04-09 DIAGNOSIS — R319 Hematuria, unspecified: Secondary | ICD-10-CM | POA: Diagnosis not present

## 2019-04-09 DIAGNOSIS — R350 Frequency of micturition: Secondary | ICD-10-CM | POA: Diagnosis not present

## 2019-04-09 LAB — COMPREHENSIVE METABOLIC PANEL
ALT: 20 U/L (ref 0–53)
AST: 29 U/L (ref 0–37)
Albumin: 4.2 g/dL (ref 3.5–5.2)
Alkaline Phosphatase: 85 U/L (ref 39–117)
BUN: 18 mg/dL (ref 6–23)
CO2: 26 mEq/L (ref 19–32)
Calcium: 9.1 mg/dL (ref 8.4–10.5)
Chloride: 105 mEq/L (ref 96–112)
Creatinine, Ser: 1.11 mg/dL (ref 0.40–1.50)
GFR: 83.12 mL/min (ref >60.00)
Glucose, Bld: 86 mg/dL (ref 70–99)
Potassium: 4 mEq/L (ref 3.5–5.1)
Sodium: 139 mEq/L (ref 135–145)
Total Bilirubin: 1.1 mg/dL (ref 0.2–1.2)
Total Protein: 6.9 g/dL (ref 6.0–8.3)

## 2019-04-09 LAB — CBC WITH DIFFERENTIAL/PLATELET
Basophils Absolute: 0 10*3/uL (ref 0.0–0.1)
Basophils Relative: 0.6 % (ref 0.0–3.0)
Eosinophils Absolute: 0.1 10*3/uL (ref 0.0–0.7)
Eosinophils Relative: 1.1 % (ref 0.0–5.0)
HCT: 39.2 % (ref 39.0–52.0)
Hemoglobin: 12.9 g/dL — ABNORMAL LOW (ref 13.0–17.0)
Lymphocytes Relative: 22.5 % (ref 12.0–46.0)
Lymphs Abs: 1.6 10*3/uL (ref 0.7–4.0)
MCHC: 33 g/dL (ref 30.0–36.0)
MCV: 89.6 fl (ref 78.0–100.0)
Monocytes Absolute: 0.7 10*3/uL (ref 0.1–1.0)
Monocytes Relative: 10.4 % (ref 3.0–12.0)
Neutro Abs: 4.7 10*3/uL (ref 1.4–7.7)
Neutrophils Relative %: 65.4 % (ref 43.0–77.0)
Platelets: 284 10*3/uL (ref 150.0–400.0)
RBC: 4.38 Mil/uL (ref 4.22–5.81)
RDW: 12.7 % (ref 11.5–15.5)
WBC: 7.2 10*3/uL (ref 4.0–10.5)

## 2019-04-09 LAB — POCT URINALYSIS DIPSTICK
Bilirubin, UA: NEGATIVE
Blood, UA: POSITIVE
Glucose, UA: NEGATIVE
Ketones, UA: POSITIVE
Nitrite, UA: NEGATIVE
Protein, UA: POSITIVE — AB
Spec Grav, UA: 1.025 (ref 1.010–1.025)
Urobilinogen, UA: 1 E.U./dL
pH, UA: 6 (ref 5.0–8.0)

## 2019-04-09 LAB — TSH: TSH: 0.99 u[IU]/mL (ref 0.35–4.50)

## 2019-04-09 LAB — POCT GLYCOSYLATED HEMOGLOBIN (HGB A1C): Hemoglobin A1C: 5.5 % (ref 4.0–5.6)

## 2019-04-09 LAB — URINALYSIS, MICROSCOPIC ONLY

## 2019-04-09 LAB — GLUCOSE, POCT (MANUAL RESULT ENTRY): POC Glucose: 105 mg/dl — AB (ref 70–99)

## 2019-04-09 NOTE — Progress Notes (Signed)
Subjective:     Patient ID: Raymond Torres, male   DOB: 15-Dec-1965, 54 y.o.   MRN: [id number]  HPI Raymond Torres is seen for follow-up from virtual visit yesterday.  He had called with several weeks of healing increased fatigue and malaise.  He had recent surgery for adenocarcinoma the prostate back in November with radiation seed implant.  He complained of urine frequency and some increased thirst.  We had him come in today for further evaluation.  one specific concern was ruling out diabetes though has not had significantly elevated blood sugars in the past.  He states that he is getting up usually all 4 and sometimes more times at night to urinate but never with any burning.  No gross hematuria.  No burning with urination.  He has not particularly noted increased urine volume.  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  He denies significant caffeine use.  No diuretic use.  He denies any recent headaches, cough, dyspnea, chest pain, fever, chills, stool changes  Past Medical History:  Diagnosis Date  . Allergic rhinitis   . Asthma    followed by pcp  . Frequency of urination   . History of Helicobacter pylori infection   . History of non-ST elevation myocardial infarction (NSTEMI) (01-25-2019  per pt has never any cardiac s&s since 2007)   09-13-2005 thought to be coronary spasm;  per cardiac cath normal coronary arteries, ef 60%, no wall motion abnormality  . Hyperlipidemia   . Hyperplasia of prostate with lower urinary tract symptoms (LUTS)   . Nocturia   . Prostate cancer [facility]) urologist--- dr Raymond Torres/  oncologist--- dr Raymond Torres   dx 11-29-2018--- Stage T1c,  Gleason 3+3  . Wears glasses    Past Surgical History:  Procedure Laterality Date  . ACHILLES TENDON REPAIR Left 08/2009   Dr Raymond Torres  . CARDIAC CATHETERIZATION  09-13-2005 & 01-04-2006  dr Raymond Torres   angiographically normal coronary arteries,  ef 60%,  no wall motion abnormality, LVEDP 6 mmHg  .  PROSTATE BIOPSY  11-20-2018  dr Raymond Torres office  . RADIOACTIVE SEED IMPLANT N/A 01/28/2019   Procedure: RADIOACTIVE SEED IMPLANT/BRACHYTHERAPY IMPLANT;  Surgeon: Raymond Mallow, MD;  Location: [facility];  Service: Urology;  Laterality: N/A;  . SPACE OAR INSTILLATION N/A 01/28/2019   Procedure: SPACE OAR INSTILLATION;  Surgeon: Raymond Mallow, MD;  Location: [facility];  Service: Urology;  Laterality: N/A;    reports that he has never smoked. He has never used smokeless tobacco. He reports current alcohol use. He reports that he does not use drugs. family history includes Cancer in his father; Hypertension in his mother; Prostate cancer in his father, maternal uncle, and paternal uncle. Allergies  Allergen Reactions  . Shellfish Allergy Hives    Shrimp, crabs   Wt Readings from Last 3 Encounters:  04/09/19 186 lb 4.8 oz (84.5 kg)  02/19/19 184 lb 9.6 oz (83.7 kg)  02/19/19 184 lb 9.6 oz (83.7 kg)     Review of Systems  Constitutional: Positive for fatigue. Negative for appetite change, chills, fever and unexpected weight change.  Respiratory: Negative for cough and shortness of breath.   Cardiovascular: Negative for chest pain, palpitations and leg swelling.  Gastrointestinal: Negative for abdominal pain.  Endocrine: Positive for polydipsia and polyuria.  Genitourinary: Negative for decreased urine volume, difficulty urinating, flank pain and hematuria.  Skin: Negative for rash.  Hematological:  Negative for adenopathy.       Objective:   Physical Exam Vitals reviewed.  Constitutional:      Appearance: Normal appearance.  HENT:     Right Ear: Ear canal normal.     Left Ear: Ear canal normal.  Cardiovascular:     Rate and Rhythm: Normal rate and regular rhythm.  Pulmonary:     Effort: Pulmonary effort is normal.     Breath sounds: Normal breath sounds.  Abdominal:     Palpations: Abdomen is soft.     Tenderness: There is no abdominal  tenderness.  Musculoskeletal:     Cervical back: Neck supple.     Right lower leg: No edema.     Left lower leg: No edema.  Skin:    Findings: No rash.  Neurological:     General: No focal deficit present.     Mental Status: He is alert and oriented to person, place, and time.        Assessment:     #1 adenocarcinoma of the prostate with recent radiation seed implant  #2 urine frequency without burning.  No significant hyperglycemia with 5-hour postprandial blood sugar 105 with A1c 5.5%.  Urine dipstick today shows positive ketones, positive blood, positive protein and trace leukocytes.  Doubt diabetes insipidus very likely but needs further evaluation  #3 nonspecific complaints of malaise and fatigue possibly related #2 and frequent disrupted sleep    Plan:     -Check further labs with TSH, CBC, comprehensive metabolic panel.  We will also check 24-hour urine osmolality  -We will send urine for culture and urine microscopy  Raymond Post MD [facility]

## 2019-04-10 LAB — URINE CULTURE
MICRO NUMBER:: 10056825
Result:: NO GROWTH
SPECIMEN QUALITY:: ADEQUATE

## 2019-04-12 DIAGNOSIS — R350 Frequency of micturition: Secondary | ICD-10-CM | POA: Diagnosis not present

## 2019-04-15 LAB — OSMOLALITY, URINE: Osmolality, Ur: 887 mOsmol/kg

## 2019-05-07 ENCOUNTER — Ambulatory Visit: Payer: BC Managed Care – PPO | Admitting: Family Medicine

## 2019-06-01 ENCOUNTER — Ambulatory Visit: Payer: Medicaid Other | Attending: Internal Medicine

## 2019-06-01 DIAGNOSIS — Z23 Encounter for immunization: Secondary | ICD-10-CM

## 2019-06-01 NOTE — Progress Notes (Signed)
   Covid-19 Vaccination Clinic  Name:  Raymond Torres    MRN: [id number] DOB: [birth year]  06/01/2019  Mr. Raymond Torres was observed post Covid-19 immunization for 15 minutes without incident. He was provided with Vaccine Information Sheet and instruction to access the V-Safe system.   Mr. Raymond Torres was instructed to call 911 with any severe reactions post vaccine: Marland Kitchen Difficulty breathing  . Swelling of face and throat  . A fast heartbeat  . A bad rash all over body  . Dizziness and weakness   Immunizations Administered    Name Date Dose VIS Date Route   Pfizer COVID-19 Vaccine 06/01/2019 10:06 AM 0.3 mL 03/01/2019 Intramuscular   Manufacturer: [place]   Lot: [id number]   [place]: [id number]

## 2019-06-13 ENCOUNTER — Other Ambulatory Visit: Payer: Self-pay | Admitting: Family Medicine

## 2019-06-13 MED ORDER — TAMSULOSIN HCL 0.4 MG PO CAPS: 0.4000 mg | ORAL_CAPSULE | Freq: Every day | ORAL | 2 refills | Status: DC

## 2019-06-13 NOTE — Telephone Encounter (Signed)
Refill sent.

## 2019-06-13 NOTE — Telephone Encounter (Signed)
Pt is requesting a refill on Tamsulosin 0.4 mg caps. Pt uses [facility] on [address]. Pt is aware that you are out of the office today and will return on 06/14/19. Thanks

## 2019-06-26 ENCOUNTER — Ambulatory Visit: Payer: Medicaid Other | Attending: Internal Medicine

## 2019-06-26 DIAGNOSIS — Z23 Encounter for immunization: Secondary | ICD-10-CM

## 2019-06-26 NOTE — Progress Notes (Signed)
   Covid-19 Vaccination Clinic  Name:  Raymond Torres    MRN: [id number] DOB: Jan 12, 1966  06/26/2019  Raymond Torres was observed post Covid-19 immunization for 15 minutes without incident. He was provided with Vaccine Information Sheet and instruction to access the V-Safe system.   Raymond Torres was instructed to call 911 with any severe reactions post vaccine: Marland Kitchen Difficulty breathing  . Swelling of face and throat  . A fast heartbeat  . A bad rash all over body  . Dizziness and weakness   Immunizations Administered    Name Date Dose VIS Date Route   Pfizer COVID-19 Vaccine 06/26/2019  8:57 AM 0.3 mL 03/01/2019 Intramuscular   Manufacturer: [place]   Lot: [id number]   [place]: [id number]

## 2019-09-03 ENCOUNTER — Other Ambulatory Visit: Payer: Self-pay

## 2019-09-04 ENCOUNTER — Ambulatory Visit (INDEPENDENT_AMBULATORY_CARE_PROVIDER_SITE_OTHER): Payer: Self-pay | Admitting: Family Medicine

## 2019-09-04 ENCOUNTER — Encounter: Payer: Self-pay | Admitting: Family Medicine

## 2019-09-04 VITALS — BP 116/68 | HR 67 | Temp 97.9°F | Ht 67.0 in | Wt 177.2 lb

## 2019-09-04 DIAGNOSIS — R319 Hematuria, unspecified: Secondary | ICD-10-CM

## 2019-09-04 DIAGNOSIS — Z0000 Encounter for general adult medical examination without abnormal findings: Secondary | ICD-10-CM

## 2019-09-04 LAB — POCT URINALYSIS DIPSTICK
Bilirubin, UA: NEGATIVE
Glucose, UA: NEGATIVE
Ketones, UA: NEGATIVE
Leukocytes, UA: NEGATIVE
Nitrite, UA: NEGATIVE
Protein, UA: NEGATIVE
Spec Grav, UA: >=1.03 — AB (ref 1.010–1.025)
Urobilinogen, UA: 0.2 E.U./dL
pH, UA: 5.5 (ref 5.0–8.0)

## 2019-09-04 LAB — URINALYSIS, MICROSCOPIC ONLY: RBC / HPF: NONE SEEN (ref 0–?)

## 2019-09-04 LAB — CBC WITH DIFFERENTIAL/PLATELET
Basophils Absolute: 0.1 10*3/uL (ref 0.0–0.1)
Basophils Relative: 2 % (ref 0.0–3.0)
Eosinophils Absolute: 0.2 10*3/uL (ref 0.0–0.7)
Eosinophils Relative: 4.3 % (ref 0.0–5.0)
HCT: 40.7 % (ref 39.0–52.0)
Hemoglobin: 13.3 g/dL (ref 13.0–17.0)
Lymphocytes Relative: 37.9 % (ref 12.0–46.0)
Lymphs Abs: 1.7 10*3/uL (ref 0.7–4.0)
MCHC: 32.8 g/dL (ref 30.0–36.0)
MCV: 90.5 fl (ref 78.0–100.0)
Monocytes Absolute: 0.6 10*3/uL (ref 0.1–1.0)
Monocytes Relative: 13.9 % — ABNORMAL HIGH (ref 3.0–12.0)
Neutro Abs: 1.9 10*3/uL (ref 1.4–7.7)
Neutrophils Relative %: 41.9 % — ABNORMAL LOW (ref 43.0–77.0)
Platelets: 286 10*3/uL (ref 150.0–400.0)
RBC: 4.49 Mil/uL (ref 4.22–5.81)
RDW: 13.2 % (ref 11.5–15.5)
WBC: 4.6 10*3/uL (ref 4.0–10.5)

## 2019-09-04 LAB — LIPID PANEL
Cholesterol: 180 mg/dL (ref 0–200)
HDL: 41.2 mg/dL (ref >39.00)
LDL Cholesterol: 117 mg/dL — ABNORMAL HIGH (ref 0–99)
NonHDL: 138.42
Total CHOL/HDL Ratio: 4
Triglycerides: 106 mg/dL (ref 0.0–149.0)
VLDL: 21.2 mg/dL (ref 0.0–40.0)

## 2019-09-04 LAB — PSA: PSA: 3.33 ng/mL (ref 0.10–4.00)

## 2019-09-04 NOTE — Patient Instructions (Signed)
Preventive Care 41-56 Years Old, Male Preventive care refers to lifestyle choices and visits with your health care provider that can promote health and wellness. This includes:  A yearly physical exam. This is also called an annual well check.  Regular dental and eye exams.  Immunizations.  Screening for certain conditions.  Healthy lifestyle choices, such as eating a healthy diet, getting regular exercise, not using drugs or products that contain nicotine and tobacco, and limiting alcohol use. What can I expect for my preventive care visit? Physical exam Your health care provider will check:  Height and weight. These may be used to calculate body mass index (BMI), which is a measurement that tells if you are at a healthy weight.  Heart rate and blood pressure.  Your skin for abnormal spots. Counseling Your health care provider may ask you questions about:  Alcohol, tobacco, and drug use.  Emotional well-being.  Home and relationship well-being.  Sexual activity.  Eating habits.  Work and work Statistician. What immunizations do I need?  Influenza (flu) vaccine  This is recommended every year. Tetanus, diphtheria, and pertussis (Tdap) vaccine  You may need a Td booster every 10 years. Varicella (chickenpox) vaccine  You may need this vaccine if you have not already been vaccinated. Zoster (shingles) vaccine  You may need this after age 64. Measles, mumps, and rubella (MMR) vaccine  You may need at least one dose of MMR if you were born in 1957 or later. You may also need a second dose. Pneumococcal conjugate (PCV13) vaccine  You may need this if you have certain conditions and were not previously vaccinated. Pneumococcal polysaccharide (PPSV23) vaccine  You may need one or two doses if you smoke cigarettes or if you have certain conditions. Meningococcal conjugate (MenACWY) vaccine  You may need this if you have certain conditions. Hepatitis A  vaccine  You may need this if you have certain conditions or if you travel or work in places where you may be exposed to hepatitis A. Hepatitis B vaccine  You may need this if you have certain conditions or if you travel or work in places where you may be exposed to hepatitis B. Haemophilus influenzae type b (Hib) vaccine  You may need this if you have certain risk factors. Human papillomavirus (HPV) vaccine  If recommended by your health care provider, you may need three doses over 6 months. You may receive vaccines as individual doses or as more than one vaccine together in one shot (combination vaccines). Talk with your health care provider about the risks and benefits of combination vaccines. What tests do I need? Blood tests  Lipid and cholesterol levels. These may be checked every 5 years, or more frequently if you are over 60 years old.  Hepatitis C test.  Hepatitis B test. Screening  Lung cancer screening. You may have this screening every year starting at age 43 if you have a 30-pack-year history of smoking and currently smoke or have quit within the past 15 years.  Prostate cancer screening. Recommendations will vary depending on your family history and other risks.  Colorectal cancer screening. All adults should have this screening starting at age 72 and continuing until age 2. Your health care provider may recommend screening at age 14 if you are at increased risk. You will have tests every 1-10 years, depending on your results and the type of screening test.  Diabetes screening. This is done by checking your blood sugar (glucose) after you have not eaten  for a while (fasting). You may have this done every 1-3 years.  Sexually transmitted disease (STD) testing. Follow these instructions at home: Eating and drinking  Eat a diet that includes fresh fruits and vegetables, whole grains, lean protein, and low-fat dairy products.  Take vitamin and mineral supplements as  recommended by your health care provider.  Do not drink alcohol if your health care provider tells you not to drink.  If you drink alcohol: ? Limit how much you have to 0-2 drinks a day. ? Be aware of how much alcohol is in your drink. In the [place] one drink equals one 12 oz bottle of beer (355 mL), one 5 oz glass of wine (148 mL), or one 1 oz glass of hard liquor (44 mL). Lifestyle  Take daily care of your teeth and gums.  Stay active. Exercise for at least 30 minutes on 5 or more days each week.  Do not use any products that contain nicotine or tobacco, such as cigarettes, e-cigarettes, and chewing tobacco. If you need help quitting, ask your health care provider.  If you are sexually active, practice safe sex. Use a condom or other form of protection to prevent STIs (sexually transmitted infections).  Talk with your health care provider about taking a low-dose aspirin every day starting at age 45. What's next?  Go to your health care provider once a year for a well check visit.  Ask your health care provider how often you should have your eyes and teeth checked.  Stay up to date on all vaccines. This information is not intended to replace advice given to you by your health care provider. Make sure you discuss any questions you have with your health care provider. Document Revised: 03/01/2018 Document Reviewed: 03/01/2018 Elsevier Patient Education  [place].  Consider shingles vaccine (Shingrix) and check on insurance coverage and let us know if interested.

## 2019-09-04 NOTE — Progress Notes (Signed)
Established Patient Office Visit  Subjective:  Patient ID: Raymond Torres, male    DOB: 1965-09-09  Age: 54 y.o. MRN: [id number]  CC:  Chief Complaint  Patient presents with  . Annual Exam    No other concerns    HPI Raymond Torres presents for physical exam.  He had elevated PSA last year and was referred to urology.  He was diagnosed with prostate cancer.  He had radiation seed implants around November.  He has not had follow-up PSA since then.  Generally fairly healthy.  Does not take any regular medications.  He had urine frequency several months ago and had work-up for that which was unrevealing.  Those symptoms have improved.  Covid vaccine already given.  No history of hepatitis C screening.  No history of Shingrix vaccine.  Tetanus up-to-date.  Colonoscopy up-to-date.  Past Medical History:  Diagnosis Date  . Allergic rhinitis   . Asthma    followed by pcp  . Frequency of urination   . History of Helicobacter pylori infection   . History of non-ST elevation myocardial infarction (NSTEMI) (01-25-2019  per pt has never any cardiac s&s since 2007)   09-13-2005 thought to be coronary spasm;  per cardiac cath normal coronary arteries, ef 60%, no wall motion abnormality  . Hyperlipidemia   . Hyperplasia of prostate with lower urinary tract symptoms (LUTS)   . Nocturia   . Prostate cancer [facility]) urologist--- dr bell/  oncologist--- dr Tammi Klippel   dx 11-29-2018--- Stage T1c,  Gleason 3+3  . Wears glasses     Past Surgical History:  Procedure Laterality Date  . ACHILLES TENDON REPAIR Left 08/2009   Dr Marlou Sa  . CARDIAC CATHETERIZATION  09-13-2005 & 01-04-2006  dr g. taylor   angiographically normal coronary arteries,  ef 60%,  no wall motion abnormality, LVEDP 6 mmHg  . PROSTATE BIOPSY  11-20-2018  dr bell office  . RADIOACTIVE SEED IMPLANT N/A 01/28/2019   Procedure: RADIOACTIVE SEED IMPLANT/BRACHYTHERAPY IMPLANT;  Surgeon: Lucas Mallow, MD;  Location[facility]  Service: Urology;  Laterality: N/A;  . SPACE OAR INSTILLATION N/A 01/28/2019   Procedure: SPACE OAR INSTILLATION;  Surgeon: Lucas Mallow, MD;  Location: [facility];  Service: Urology;  Laterality: N/A;    Family History  Problem Relation Age of Onset  . Hypertension Mother   . Cancer Father        lung and prostate  . Prostate cancer Father   . Prostate cancer Maternal Uncle   . Prostate cancer Paternal Uncle   . Colon cancer Neg Hx     Social History   Socioeconomic History  . Marital status: Married    Spouse name: Not on file  . Number of children: 3  . Years of education: Not on file  . Highest education level: Not on file  Occupational History  . Occupation: Best boy: [employer]  Tobacco Use  . Smoking status: Never Smoker  . Smokeless tobacco: Never Used  Vaping Use  . Vaping Use: Never used  Substance and Sexual Activity  . Alcohol use: Yes    Comment: occasional  . Drug use: Never  . Sexual activity: Yes  Other Topics Concern  . Not on file  Social History Narrative   Works as Freight forwarder at [employer]   3 children ages 68 (daugter), 72 (daughter) and 63 (son)   Social Determinants of Health  Financial Resource Strain:   . Difficulty of Paying Living Expenses:   Food Insecurity:   . Worried About Charity fundraiser in the Last Year:   . Arboriculturist in the Last Year:   Transportation Needs:   . Film/video editor (Medical):   Marland Kitchen Lack of Transportation (Non-Medical):   Physical Activity:   . Days of Exercise per Week:   . Minutes of Exercise per Session:   Stress:   . Feeling of Stress :   Social Connections:   . Frequency of Communication with Friends and Family:   . Frequency of Social Gatherings with Friends and Family:   . Attends Religious Services:   . Active Member of Clubs or Organizations:   . Attends Archivist Meetings:   Marland Kitchen Marital Status:   Intimate Partner Violence:     . Fear of Current or Ex-Partner:   . Emotionally Abused:   Marland Kitchen Physically Abused:   . Sexually Abused:     Outpatient Medications Prior to Visit  Medication Sig Dispense Refill  . tamsulosin (FLOMAX) 0.4 MG CAPS capsule Take 1 capsule (0.4 mg total) by mouth daily. 30 capsule 2  . mometasone-formoterol (DULERA) 100-5 MCG/ACT AERO Inhale 2 puffs into the lungs 2 (two) times daily. (Patient not taking: Reported on 02/19/2019) 13 g 11   No facility-administered medications prior to visit.    Allergies  Allergen Reactions  . Shellfish Allergy Hives    Shrimp, crabs    ROS Review of Systems  Constitutional: Negative for activity change, appetite change, fatigue and fever.  HENT: Negative for congestion, ear pain and trouble swallowing.   Eyes: Negative for pain and visual disturbance.  Respiratory: Negative for cough, shortness of breath and wheezing.   Cardiovascular: Negative for chest pain and palpitations.  Gastrointestinal: Negative for abdominal distention, abdominal pain, blood in stool, constipation, diarrhea, nausea, rectal pain and vomiting.  Endocrine: Negative for polydipsia and polyuria.  Genitourinary: Negative for dysuria, hematuria and testicular pain.  Musculoskeletal: Negative for arthralgias and joint swelling.  Skin: Negative for rash.  Neurological: Negative for dizziness, syncope and headaches.  Hematological: Negative for adenopathy.  Psychiatric/Behavioral: Negative for confusion and dysphoric mood.      Objective:    Physical Exam Constitutional:      General: He is not in acute distress.    Appearance: He is well-developed.  HENT:     Head: Normocephalic and atraumatic.     Right Ear: External ear normal.     Left Ear: External ear normal.  Eyes:     Conjunctiva/sclera: Conjunctivae normal.     Pupils: Pupils are equal, round, and reactive to light.  Neck:     Thyroid: No thyromegaly.  Cardiovascular:     Rate and Rhythm: Normal rate and  regular rhythm.     Heart sounds: Normal heart sounds. No murmur heard.   Pulmonary:     Effort: No respiratory distress.     Breath sounds: No wheezing or rales.  Abdominal:     General: Bowel sounds are normal. There is no distension.     Palpations: Abdomen is soft. There is no mass.     Tenderness: There is no abdominal tenderness. There is no guarding or rebound.  Musculoskeletal:     Cervical back: Normal range of motion and neck supple.     Right lower leg: No edema.     Left lower leg: No edema.  Lymphadenopathy:     Cervical:  No cervical adenopathy.  Skin:    Findings: No rash.  Neurological:     Mental Status: He is alert and oriented to person, place, and time.     Cranial Nerves: No cranial nerve deficit.     Deep Tendon Reflexes: Reflexes normal.     BP 116/68 (BP Location: Left Arm, Patient Position: Sitting, Cuff Size: Normal)   Pulse 67   Temp 97.9 F (36.6 C) (Temporal)   Ht 5\' 7"  (1.702 m)   Wt 177 lb 3.2 oz (80.4 kg)   SpO2 99%   BMI 27.75 kg/m  Wt Readings from Last 3 Encounters:  09/04/19 177 lb 3.2 oz (80.4 kg)  04/09/19 186 lb 4.8 oz (84.5 kg)  02/19/19 184 lb 9.6 oz (83.7 kg)     Health Maintenance Due  Topic Date Due  . Hepatitis C Screening  Never done  . HIV Screening  Never done    There are no preventive care reminders to display for this patient.  Lab Results  Component Value Date   TSH 0.99 04/09/2019   Lab Results  Component Value Date   WBC 7.2 04/09/2019   HGB 12.9 (L) 04/09/2019   HCT 39.2 04/09/2019   MCV 89.6 04/09/2019   PLT 284.0 04/09/2019   Lab Results  Component Value Date   NA 139 04/09/2019   K 4.0 04/09/2019   CO2 26 04/09/2019   GLUCOSE 86 04/09/2019   BUN 18 04/09/2019   CREATININE 1.11 04/09/2019   BILITOT 1.1 04/09/2019   ALKPHOS 85 04/09/2019   AST 29 04/09/2019   ALT 20 04/09/2019   PROT 6.9 04/09/2019   ALBUMIN 4.2 04/09/2019   CALCIUM 9.1 04/09/2019   ANIONGAP 11 01/24/2019   GFR 83.12  04/09/2019   Lab Results  Component Value Date   CHOL 175 04/04/2018   Lab Results  Component Value Date   HDL 48.40 04/04/2018   Lab Results  Component Value Date   LDLCALC 109 (H) 04/04/2018   Lab Results  Component Value Date   TRIG 89.0 04/04/2018   Lab Results  Component Value Date   CHOLHDL 4 04/04/2018   Lab Results  Component Value Date   HGBA1C 5.5 04/09/2019      Assessment & Plan:   Problem List Items Addressed This Visit    None    Visit Diagnoses    Physical exam    -  Primary   Relevant Orders   CBC with Differential/Platelet   Lipid panel   Hep C Antibody   PSA   POCT urinalysis dipstick    We discussed Shingrix vaccine.  He will check on insurance coverage and let us know if interested  Checking hepatitis C antibody as above.  He is low risk.  We will continue with annual flu vaccine  Continue with at least annual PSA unless otherwise instructed by urology.  No orders of the defined types were placed in this encounter.   Follow-up: No follow-ups on file.    Carolann Littler, MD

## 2019-09-05 LAB — HEPATITIS C ANTIBODY
Hepatitis C Ab: NONREACTIVE
SIGNAL TO CUT-OFF: 0.01 (ref <1.00)

## 2019-12-03 ENCOUNTER — Other Ambulatory Visit: Payer: Self-pay

## 2019-12-03 ENCOUNTER — Encounter: Payer: Self-pay | Admitting: Family Medicine

## 2019-12-03 ENCOUNTER — Other Ambulatory Visit: Payer: Self-pay | Admitting: Family Medicine

## 2019-12-03 ENCOUNTER — Ambulatory Visit (INDEPENDENT_AMBULATORY_CARE_PROVIDER_SITE_OTHER): Payer: Managed Care, Other (non HMO) | Admitting: Family Medicine

## 2019-12-03 VITALS — BP 92/60 | HR 59 | Temp 98.3°F | Ht 67.0 in | Wt 171.4 lb

## 2019-12-03 DIAGNOSIS — R350 Frequency of micturition: Secondary | ICD-10-CM | POA: Diagnosis not present

## 2019-12-03 DIAGNOSIS — C61 Malignant neoplasm of prostate: Secondary | ICD-10-CM | POA: Diagnosis not present

## 2019-12-03 DIAGNOSIS — Z23 Encounter for immunization: Secondary | ICD-10-CM | POA: Diagnosis not present

## 2019-12-03 LAB — POC URINALSYSI DIPSTICK (AUTOMATED)
Bilirubin, UA: NEGATIVE
Glucose, UA: NEGATIVE
Ketones, UA: NEGATIVE
Leukocytes, UA: NEGATIVE
Nitrite, UA: NEGATIVE
Protein, UA: POSITIVE — AB
Spec Grav, UA: >=1.03 — AB (ref 1.010–1.025)
Urobilinogen, UA: 0.2 E.U./dL
pH, UA: 6 (ref 5.0–8.0)

## 2019-12-03 LAB — TIQ-MISC

## 2019-12-03 MED ORDER — TAMSULOSIN HCL 0.4 MG PO CAPS: 0.4000 mg | ORAL_CAPSULE | Freq: Every day | ORAL | 5 refills | Status: DC

## 2019-12-03 NOTE — Patient Instructions (Signed)
Start back the Tamsulosin 0.4 mg one daily   If no improvement in symptoms after 5-6 days may increase up to 0.8 mg     If that does not help let me know.

## 2019-12-03 NOTE — Progress Notes (Signed)
Established Patient Office Visit  Subjective:  Patient ID: Raymond Torres, male    DOB: 08/23/1965  Age: 54 y.o. MRN: [id number]  CC:  Chief Complaint  Patient presents with  . Follow-up  . Urinary Frequency    worse past 3 weeks, since procedure by Dr Gloriann Loan    HPI Raymond Torres presents for increased urine frequency and slow dribbling stream of urine over the past few weeks.  He states he frequently is going about 6-7 times per day and usually about twice at night.  He has prostate cancer and had radiation seed implants around last November.  He denies any recent burning with urination.  No fevers or chills.  No gross hematuria.  Last PSA was 3.3.  He drinks about 1 cup of coffee in the morning.  No alcohol use.  No diuretic use.  No recent anticholinergic or decongestant use.  He did take tamsulosin once previously but did not see much change.  Past Medical History:  Diagnosis Date  . Allergic rhinitis   . Asthma    followed by pcp  . Frequency of urination   . History of Helicobacter pylori infection   . History of non-ST elevation myocardial infarction (NSTEMI) (01-25-2019  per pt has never any cardiac s&s since 2007)   09-13-2005 thought to be coronary spasm;  per cardiac cath normal coronary arteries, ef 60%, no wall motion abnormality  . Hyperlipidemia   . Hyperplasia of prostate with lower urinary tract symptoms (LUTS)   . Nocturia   . Prostate cancer [facility]) urologist--- dr bell/  oncologist--- dr Tammi Klippel   dx 11-29-2018--- Stage T1c,  Gleason 3+3  . Wears glasses     Past Surgical History:  Procedure Laterality Date  . ACHILLES TENDON REPAIR Left 08/2009   Dr Marlou Sa  . CARDIAC CATHETERIZATION  09-13-2005 & 01-04-2006  dr g. taylor   angiographically normal coronary arteries,  ef 60%,  no wall motion abnormality, LVEDP 6 mmHg  . PROSTATE BIOPSY  11-20-2018  dr bell office  . RADIOACTIVE SEED IMPLANT N/A 01/28/2019   Procedure: RADIOACTIVE SEED IMPLANT/BRACHYTHERAPY  IMPLANT;  Surgeon: Lucas Mallow, MD;  Location: [facility];  Service: Urology;  Laterality: N/A;  . SPACE OAR INSTILLATION N/A 01/28/2019   Procedure: SPACE OAR INSTILLATION;  Surgeon: Lucas Mallow, MD;  Location: [facility];  Service: Urology;  Laterality: N/A;    Family History  Problem Relation Age of Onset  . Hypertension Mother   . Cancer Father        lung and prostate  . Prostate cancer Father   . Prostate cancer Maternal Uncle   . Prostate cancer Paternal Uncle   . Colon cancer Neg Hx     Social History   Socioeconomic History  . Marital status: Married    Spouse name: Not on file  . Number of children: 3  . Years of education: Not on file  . Highest education level: Not on file  Occupational History  . Occupation: Best boy: [employer]  Tobacco Use  . Smoking status: Never Smoker  . Smokeless tobacco: Never Used  Vaping Use  . Vaping Use: Never used  Substance and Sexual Activity  . Alcohol use: Yes    Comment: occasional  . Drug use: Never  . Sexual activity: Yes  Other Topics Concern  . 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 Difficulty of Paying Living Expenses: Not on file  Food Insecurity:   . Worried About Charity fundraiser in the Last Year: Not on file  . Ran Out of Food in the Last Year: Not on file  Transportation Needs:   . Lack of Transportation (Medical): Not on file  . Lack of Transportation (Non-Medical): Not on file  Physical Activity:   . Days of Exercise per Week: Not on file  . Minutes of Exercise per Session: Not on file  Stress:   . Feeling of Stress : Not on file  Social Connections:   . Frequency of Communication with Friends and Family: Not on file  . Frequency of Social Gatherings with Friends and Family: Not  on file  . Attends Religious Services: Not on file  . Active Member of Clubs or Organizations: Not on file  . Attends Archivist Meetings: Not on file  . Marital Status: Not on file  Intimate Partner Violence:   . Fear of Current or Ex-Partner: Not on file  . Emotionally Abused: Not on file  . Physically Abused: Not on file  . Sexually Abused: Not on file    Outpatient Medications Prior to Visit  Medication Sig Dispense Refill  . mometasone-formoterol (DULERA) 100-5 MCG/ACT AERO Inhale 2 puffs into the lungs 2 (two) times daily. 13 g 11  . tamsulosin (FLOMAX) 0.4 MG CAPS capsule Take 1 capsule (0.4 mg total) by mouth daily. 30 capsule 2   No facility-administered medications prior to visit.    Allergies  Allergen Reactions  . Shellfish Allergy Hives    Shrimp, crabs    ROS Review of Systems  Constitutional: Negative for chills and fever.  Genitourinary: Positive for difficulty urinating and frequency. Negative for dysuria, flank pain and hematuria.      Objective:    Physical Exam Vitals reviewed.  Constitutional:      Appearance: Normal appearance.  Cardiovascular:     Rate and Rhythm: Normal rate and regular rhythm.  Pulmonary:     Effort: Pulmonary effort is normal.     Breath sounds: Normal breath sounds.  Neurological:     Mental Status: He is alert.     BP 92/60 (BP Location: Left Arm, Patient Position: Sitting, Cuff Size: Normal)   Pulse (!) 59   Temp 98.3 F (36.8 C) (Oral)   Ht 5\' 7"  (1.702 m)   Wt 171 lb 6.4 oz (77.7 kg)   BMI 26.85 kg/m  Wt Readings from Last 3 Encounters:  12/03/19 171 lb 6.4 oz (77.7 kg)  09/04/19 177 lb 3.2 oz (80.4 kg)  04/09/19 186 lb 4.8 oz (84.5 kg)     Health Maintenance Due  Topic Date Due  . HIV Screening  Never done    There are no preventive care reminders to display for this patient.  Lab Results  Component Value Date   TSH 0.99 04/09/2019   Lab Results  Component Value Date   WBC 4.6  09/04/2019   HGB 13.3 09/04/2019   HCT 40.7 09/04/2019   MCV 90.5 09/04/2019   PLT 286.0 09/04/2019   Lab Results  Component Value Date   NA 139 04/09/2019   K 4.0 04/09/2019   CO2 26 04/09/2019   GLUCOSE 86 04/09/2019   BUN 18 04/09/2019   CREATININE 1.11 04/09/2019  BILITOT 1.1 04/09/2019   ALKPHOS 85 04/09/2019   AST 29 04/09/2019   ALT 20 04/09/2019   PROT 6.9 04/09/2019   ALBUMIN 4.2 04/09/2019   CALCIUM 9.1 04/09/2019   ANIONGAP 11 01/24/2019   GFR 83.12 04/09/2019   Lab Results  Component Value Date   CHOL 180 09/04/2019   Lab Results  Component Value Date   HDL 41.20 09/04/2019   Lab Results  Component Value Date   LDLCALC 117 (H) 09/04/2019   Lab Results  Component Value Date   TRIG 106.0 09/04/2019   Lab Results  Component Value Date   CHOLHDL 4 09/04/2019   Lab Results  Component Value Date   HGBA1C 5.5 04/09/2019      Assessment & Plan:   Urine frequency and slow stream.  Episode reveals 2+ blood otherwise negative.  No evidence for infection.  Urine sent for microscopy.  Start back tamsulosin 0.4 mg nightly.  If no improvement after 1 week may titrate up to 0.8 mg.  If no improvement with that will recommend follow-up with urology given his history.  If urine microscopy reveals greater than 3 red blood cells per high-power field we will recommend referral back to urology.  Meds ordered this encounter  Medications  . tamsulosin (FLOMAX) 0.4 MG CAPS capsule    Sig: Take 1 capsule (0.4 mg total) by mouth daily.    Dispense:  30 capsule    Refill:  5    Follow-up: No follow-ups on file.    Carolann Littler, MD

## 2019-12-05 LAB — URINALYSIS, MICROSCOPIC ONLY
Bacteria, UA: NONE SEEN /HPF
Hyaline Cast: NONE SEEN /LPF
Squamous Epithelial / HPF: NONE SEEN /HPF (ref <=5)

## 2020-02-20 ENCOUNTER — Telehealth: Payer: Self-pay | Admitting: Family Medicine

## 2020-02-20 NOTE — Telephone Encounter (Signed)
Patient is calling and wanted to see if he can prescription dosage for tamsulosin (FLOMAX) 0.4 MG CAPS capsule double and sent to [facility] ([place] [place]  Phone:  [phone] Fax:  [phone]  CB is [phone]

## 2020-02-20 NOTE — Telephone Encounter (Signed)
Flomax comes in dose of 0.8 mg.   Increase Flomax to 0.8 mg po qd and may refill for one year.

## 2020-02-20 NOTE — Telephone Encounter (Signed)
Patient says that 2 capsules are working well for and him and he would like a new prescription.

## 2020-02-24 MED ORDER — TAMSULOSIN HCL 0.4 MG PO CAPS: 0.8000 mg | ORAL_CAPSULE | Freq: Every day | ORAL | 3 refills | Status: DC

## 2020-02-24 NOTE — Telephone Encounter (Signed)
New tamsulosin rx sent to pharmacy.

## 2020-07-20 ENCOUNTER — Other Ambulatory Visit: Payer: Self-pay

## 2020-07-21 ENCOUNTER — Ambulatory Visit (INDEPENDENT_AMBULATORY_CARE_PROVIDER_SITE_OTHER): Payer: Managed Care, Other (non HMO) | Admitting: Family Medicine

## 2020-07-21 ENCOUNTER — Encounter: Payer: Self-pay | Admitting: Family Medicine

## 2020-07-21 VITALS — BP 124/64 | HR 81 | Temp 98.0°F | Wt 170.4 lb

## 2020-07-21 DIAGNOSIS — K4090 Unilateral inguinal hernia, without obstruction or gangrene, not specified as recurrent: Secondary | ICD-10-CM | POA: Diagnosis not present

## 2020-07-21 NOTE — Patient Instructions (Signed)
Inguinal Hernia, Adult An inguinal hernia develops when fat or the intestines push through a weak spot in a muscle where the leg meets the lower abdomen (groin). This creates a bulge. This kind of hernia could also be:  In the scrotum, if you are male.  In folds of skin around the vagina, if you are male. There are three types of inguinal hernias:  Hernias that can be pushed back into the abdomen (are reducible). This type rarely causes pain.  Hernias that are not reducible (are incarcerated).  Hernias that are not reducible and lose their blood supply (are strangulated). This type of hernia requires emergency surgery. What are the causes? This condition is caused by having a weak spot in the muscles or tissues in your groin. This develops over time. The hernia may poke through the weak spot when you suddenly strain your lower abdominal muscles, such as when you:  Lift a heavy object.  Strain to have a bowel movement. Constipation can lead to straining.  Cough. What increases the risk? This condition is more likely to develop in:  Males.  Pregnant females.  People who: ? Are overweight. ? Work in jobs that require long periods of standing or heavy lifting. ? Have had an inguinal hernia before. ? Smoke or have lung disease. These factors can lead to long-term (chronic) coughing. What are the signs or symptoms? Symptoms may depend on the size of the hernia. Often, a small inguinal hernia has no symptoms. Symptoms of a larger hernia may include:  A bulge in the groin area. This is easier to see when standing. It might not be visible when lying down.  Pain or burning in the groin. This may get worse when lifting, straining, or coughing.  A dull ache or a feeling of pressure in the groin.  An unusual bulge in the scrotum, in males. Symptoms of a strangulated inguinal hernia may include:  A bulge in your groin that is very painful and tender to the touch.  A bulge that  turns red or purple.  Fever, nausea, and vomiting.  Inability to have a bowel movement or to pass gas. How is this diagnosed? This condition is diagnosed based on your symptoms, your medical history, and a physical exam. Your health care provider may feel your groin area and ask you to cough. How is this treated? Treatment depends on the size of your hernia and whether you have symptoms. If you do not have symptoms, your health care provider may have you watch your hernia carefully and have you come in for follow-up visits. If your hernia is large or if you have symptoms, you may need surgery to repair the hernia. Follow these instructions at home: Lifestyle  Avoid lifting heavy objects.  Avoid standing for long periods of time.  Do not use any products that contain nicotine or tobacco. These products include cigarettes, chewing tobacco, and vaping devices, such as e-cigarettes. If you need help quitting, ask your health care provider.  Maintain a healthy weight. Preventing constipation You may need to take these actions to prevent or treat constipation:  Drink enough fluid to keep your urine pale yellow.  Take over-the-counter or prescription medicines.  Eat foods that are high in fiber, such as beans, whole grains, and fresh fruits and vegetables.  Limit foods that are high in fat and processed sugars, such as fried or sweet foods. General instructions  You may try to push the hernia back in place by very gently   pressing on it while lying down. Do not try to force the bulge back in if it will not push in easily.  Watch your hernia for any changes in shape, size, or color. Get help right away if you notice any changes.  Take over-the-counter and prescription medicines only as told by your health care provider.  Keep all follow-up visits. This is important. Contact a health care provider if:  You have a fever or chills.  You develop new symptoms.  Your symptoms get  worse. Get help right away if:  You have pain in your groin that suddenly gets worse.  You have a bulge in your groin that: ? Suddenly gets bigger and does not get smaller. ? Becomes red or purple or painful to the touch.  You are a man and you have a sudden pain in your scrotum, or the size of your scrotum suddenly changes.  You cannot push the hernia back in place by very gently pressing on it when you are lying down.  You have nausea or vomiting that does not go away.  You have a fast heartbeat.  You cannot have a bowel movement or pass gas. These symptoms may represent a serious problem that is an emergency. Do not wait to see if the symptoms will go away. Get medical help right away. Call your local emergency services (911 in the [place] Summary  An inguinal hernia develops when fat or the intestines push through a weak spot in a muscle where your leg meets your lower abdomen (groin).  This condition is caused by having a weak spot in muscles or tissues in your groin.  Symptoms may depend on the size of the hernia, and they may include pain or swelling in your groin. A small inguinal hernia often has no symptoms.  Treatment may not be needed if you do not have symptoms. If you have symptoms or a large hernia, you may need surgery to repair the hernia.  Avoid lifting heavy objects. Also, avoid standing for long periods of time. This information is not intended to replace advice given to you by your health care provider. Make sure you discuss any questions you have with your health care provider. Document Revised: 11/05/2019 Document Reviewed: 11/05/2019 Elsevier Patient Education  2021 Elsevier Inc.  

## 2020-07-21 NOTE — Progress Notes (Signed)
Established Patient Office Visit  Subjective:  Patient ID: Raymond Torres, male    DOB: 1965-11-27  Age: 55 y.o. MRN: [id number]  CC:  Chief Complaint  Patient presents with  . Abdominal Pain    X 2 weeks, lower right side abdominal pain, regular BM    HPI Raymond Torres presents for approximately 2-week history of intermittent pain and mild swelling right inguinal region.  Denies any recent specific injury.  Pain and swelling are somewhat intermittent.  He has had normal bowel movements.  No fever.  No appetite or weight changes.  No nausea or vomiting.  No urinary symptoms.  He is noted a couple of times small bulge in this region with straining.  No prior history of hernia.  Past Medical History:  Diagnosis Date  . Allergic rhinitis   . Asthma    followed by pcp  . Frequency of urination   . History of Helicobacter pylori infection   . History of non-ST elevation myocardial infarction (NSTEMI) (01-25-2019  per pt has never any cardiac s&s since 2007)   09-13-2005 thought to be coronary spasm;  per cardiac cath normal coronary arteries, ef 60%, no wall motion abnormality  . Hyperlipidemia   . Hyperplasia of prostate with lower urinary tract symptoms (LUTS)   . Nocturia   . Prostate cancer [facility]) urologist--- dr bell/  oncologist--- dr Tammi Klippel   dx 11-29-2018--- Stage T1c,  Gleason 3+3  . Wears glasses     Past Surgical History:  Procedure Laterality Date  . ACHILLES TENDON REPAIR Left 08/2009   Dr Marlou Sa  . CARDIAC CATHETERIZATION  09-13-2005 & 01-04-2006  dr g. taylor   angiographically normal coronary arteries,  ef 60%,  no wall motion abnormality, LVEDP 6 mmHg  . PROSTATE BIOPSY  11-20-2018  dr bell office  . RADIOACTIVE SEED IMPLANT N/A 01/28/2019   Procedure: RADIOACTIVE SEED IMPLANT/BRACHYTHERAPY IMPLANT;  Surgeon: Lucas Mallow, MD;  Location: [facility];  Service: Urology;  Laterality: N/A;  . SPACE OAR INSTILLATION N/A 01/28/2019   Procedure:  SPACE OAR INSTILLATION;  Surgeon: Lucas Mallow, MD;  Location: [facility];  Service: Urology;  Laterality: N/A;    Family History  Problem Relation Age of Onset  . Hypertension Mother   . Cancer Father        lung and prostate  . Prostate cancer Father   . Prostate cancer Maternal Uncle   . Prostate cancer Paternal Uncle   . Colon cancer Neg Hx     Social History   Socioeconomic History  . Marital status: Married    Spouse name: Not on file  . Number of children: 3  . Years of education: Not on file  . Highest education level: Not on file  Occupational History  . Occupation: Best boy: [employer]  Tobacco Use  . Smoking status: Never Smoker  . Smokeless tobacco: Never Used  Vaping Use  . Vaping Use: Never used  Substance and Sexual Activity  . Alcohol use: Yes    Comment: occasional  . Drug use: Never  . Sexual activity: Yes  Other Topics Concern  . Not on file  Social History Narrative   Works as Freight forwarder at [employer]   3 children ages 72 (daugter), 42 (daughter) and 62 (son)   Social Determinants of Health   Financial Resource Strain: Not on file  Food Insecurity: Not on file  Transportation Needs: Not  on file  Physical Activity: Not on file  Stress: Not on file  Social Connections: Not on file  Intimate Partner Violence: Not on file    Outpatient Medications Prior to Visit  Medication Sig Dispense Refill  . mometasone-formoterol (DULERA) 100-5 MCG/ACT AERO Inhale 2 puffs into the lungs 2 (two) times daily. 13 g 11  . tamsulosin (FLOMAX) 0.4 MG CAPS capsule Take 2 capsules (0.8 mg total) by mouth daily. 90 capsule 3   No facility-administered medications prior to visit.    Allergies  Allergen Reactions  . Shellfish Allergy Hives    Shrimp, crabs    ROS Review of Systems  Constitutional: Negative for appetite change, chills, fever and unexpected weight change.  Respiratory: Negative for cough.    Cardiovascular: Negative for chest pain.  Gastrointestinal: Positive for abdominal pain. Negative for blood in stool, constipation, diarrhea, nausea and vomiting.      Objective:    Physical Exam Vitals reviewed.  Cardiovascular:     Rate and Rhythm: Normal rate and regular rhythm.  Pulmonary:     Effort: Pulmonary effort is normal.     Breath sounds: Normal breath sounds.  Abdominal:     General: Bowel sounds are normal.     Palpations: Abdomen is soft. There is no hepatomegaly or splenomegaly.     Tenderness: There is no abdominal tenderness.     Comments: He has a small bulge right inguinal region with straining.  Does seem to have a small palpable defect in this region.  Neurological:     Mental Status: He is alert.     BP 124/64 (BP Location: Left Arm, Patient Position: Sitting, Cuff Size: Normal)   Pulse 81   Temp 98 F (36.7 C) (Oral)   Wt 170 lb 6.4 oz (77.3 kg)   SpO2 98%   BMI 26.69 kg/m  Wt Readings from Last 3 Encounters:  07/21/20 170 lb 6.4 oz (77.3 kg)  12/03/19 171 lb 6.4 oz (77.7 kg)  09/04/19 177 lb 3.2 oz (80.4 kg)     Health Maintenance Due  Topic Date Due  . HIV Screening  Never done  . COVID-19 Vaccine (3 - Pfizer risk 4-dose series) 07/24/2019    There are no preventive care reminders to display for this patient.  Lab Results  Component Value Date   TSH 0.99 04/09/2019   Lab Results  Component Value Date   WBC 4.6 09/04/2019   HGB 13.3 09/04/2019   HCT 40.7 09/04/2019   MCV 90.5 09/04/2019   PLT 286.0 09/04/2019   Lab Results  Component Value Date   NA 139 04/09/2019   K 4.0 04/09/2019   CO2 26 04/09/2019   GLUCOSE 86 04/09/2019   BUN 18 04/09/2019   CREATININE 1.11 04/09/2019   BILITOT 1.1 04/09/2019   ALKPHOS 85 04/09/2019   AST 29 04/09/2019   ALT 20 04/09/2019   PROT 6.9 04/09/2019   ALBUMIN 4.2 04/09/2019   CALCIUM 9.1 04/09/2019   ANIONGAP 11 01/24/2019   GFR 83.12 04/09/2019   Lab Results  Component Value  Date   CHOL 180 09/04/2019   Lab Results  Component Value Date   HDL 41.20 09/04/2019   Lab Results  Component Value Date   LDLCALC 117 (H) 09/04/2019   Lab Results  Component Value Date   TRIG 106.0 09/04/2019   Lab Results  Component Value Date   CHOLHDL 4 09/04/2019   Lab Results  Component Value Date   HGBA1C 5.5 04/09/2019  Assessment & Plan:   Problem List Items Addressed This Visit   None   Visit Diagnoses    Right inguinal hernia    -  Primary    Patient has evidence for probable small right inguinal hernia.  Minimally symptomatic at this time.  We discussed option of surgical referral but he wishes to wait at this time.  We did discuss signs and symptoms of strangulation to watch out for.  Follow-up if he wants to proceed with surgical referral.  No orders of the defined types were placed in this encounter.   Follow-up: No follow-ups on file.    Carolann Littler, MD

## 2020-08-12 ENCOUNTER — Telehealth: Payer: Self-pay | Admitting: Family Medicine

## 2020-08-12 MED ORDER — DULERA 100-5 MCG/ACT IN AERO: 2.0000 | INHALATION_SPRAY | Freq: Two times a day (BID) | RESPIRATORY_TRACT | 5 refills | Status: DC

## 2020-08-12 NOTE — Telephone Encounter (Signed)
Rx has been sent in. Nothing further needed. 

## 2020-08-12 NOTE — Telephone Encounter (Signed)
Patient is calling and requesting a refill for mometasone-formoterol (DULERA) 100-5 MCG/ACT AERO and also Albuterol Inhaler (not on med list) to be sent to   [facility] ([facility]), [place] - [address]  [address]) [place] [address]  Phone:  [phone] Fax:  [phone] CB is [phone]

## 2020-08-14 ENCOUNTER — Telehealth: Payer: Self-pay | Admitting: Family Medicine

## 2020-08-14 NOTE — Telephone Encounter (Signed)
ATC, unable to leave a message.  

## 2020-08-14 NOTE — Telephone Encounter (Signed)
Pt call and stated he was diagnosed with covid and want to know what do he need to do and want a call back.

## 2020-08-18 NOTE — Telephone Encounter (Signed)
ATC, unable to leave a message.  

## 2020-08-18 NOTE — Telephone Encounter (Signed)
Patient called in and sl[poke with Lawana. She is working on getting the patient scheduled. Nothing further needed.

## 2020-08-19 ENCOUNTER — Telehealth (INDEPENDENT_AMBULATORY_CARE_PROVIDER_SITE_OTHER): Payer: Managed Care, Other (non HMO) | Admitting: Family Medicine

## 2020-08-19 ENCOUNTER — Other Ambulatory Visit: Payer: Self-pay

## 2020-08-19 DIAGNOSIS — U071 COVID-19: Secondary | ICD-10-CM

## 2020-08-19 MED ORDER — ALBUTEROL SULFATE HFA 108 (90 BASE) MCG/ACT IN AERS: 2.0000 | INHALATION_SPRAY | RESPIRATORY_TRACT | 1 refills | Status: DC | PRN

## 2020-08-19 NOTE — Progress Notes (Signed)
Patient ID: Raymond Torres, male   DOB: 05-03-1965, 55 y.o.   MRN: [id number]  This visit type was conducted due to national recommendations for restrictions regarding the COVID-19 pandemic in an effort to limit this patient's exposure and mitigate transmission in our community.   Virtual Visit via Video Note  I connected with Raymond Torres on 08/19/20 at  5:00 PM EDT by a video enabled telemedicine application and verified that I am speaking with the correct person using two identifiers.  Location patient: home Location provider:work or home office Persons participating in the virtual visit: patient, provider  I discussed the limitations of evaluation and management by telemedicine and the availability of in person appointments. The patient expressed understanding and agreed to proceed.   HPI:  Raymond Torres called with positive COVID diagnosis recently.  He states last Thursday he developed some cough and headache and mild fatigue.  No fever.  He had actually called for refill of Dulera inhaler but cannot get this filled because of insurance issues.  He went Friday and had positive COVID test.  He states he feels much better at this time.  He has been out of work and will need a work release note.  He has had the COVID vaccine as well as 1 booster.  He does have some mild intermittent asthma in the past but feels like he is not having any shortness of breath or wheezing at this time.  He had some mild shortness of breath last week.  No nausea or vomiting.  No diarrhea.  No loss of taste or smell.  Feels almost back to baseline at this time.   ROS: See pertinent positives and negatives per HPI.  Past Medical History:  Diagnosis Date  . Allergic rhinitis   . Asthma    followed by pcp  . Frequency of urination   . History of Helicobacter pylori infection   . History of non-ST elevation myocardial infarction (NSTEMI) (01-25-2019  per pt has never any cardiac s&s since 2007)   09-13-2005 thought to be  coronary spasm;  per cardiac cath normal coronary arteries, ef 60%, no wall motion abnormality  . Hyperlipidemia   . Hyperplasia of prostate with lower urinary tract symptoms (LUTS)   . Nocturia   . Prostate cancer [facility]) urologist--- dr bell/  oncologist--- dr Tammi Klippel   dx 11-29-2018--- Stage T1c,  Gleason 3+3  . Wears glasses     Past Surgical History:  Procedure Laterality Date  . ACHILLES TENDON REPAIR Left 08/2009   Dr Marlou Sa  . CARDIAC CATHETERIZATION  09-13-2005 & 01-04-2006  dr g. taylor   angiographically normal coronary arteries,  ef 60%,  no wall motion abnormality, LVEDP 6 mmHg  . PROSTATE BIOPSY  11-20-2018  dr bell office  . RADIOACTIVE SEED IMPLANT N/A 01/28/2019   Procedure: RADIOACTIVE SEED IMPLANT/BRACHYTHERAPY IMPLANT;  Surgeon: Lucas Mallow, MD;  Location: [facility];  Service: Urology;  Laterality: N/A;  . SPACE OAR INSTILLATION N/A 01/28/2019   Procedure: SPACE OAR INSTILLATION;  Surgeon: Lucas Mallow, MD;  [facility];  Service: Urology;  Laterality: N/A;    Family History  Problem Relation Age of Onset  . Hypertension Mother   . Cancer Father        lung and prostate  . Prostate cancer Father   . Prostate cancer Maternal Uncle   . Prostate cancer Paternal Uncle   . Colon cancer Neg Hx     SOCIAL  HX: Non-smoker   Current Outpatient Medications:  .  albuterol (VENTOLIN HFA) 108 (90 Base) MCG/ACT inhaler, Inhale 2 puffs into the lungs every 4 (four) hours as needed for wheezing or shortness of breath., Disp: 8 g, Rfl: 1 .  mometasone-formoterol (DULERA) 100-5 MCG/ACT AERO, Inhale 2 puffs into the lungs 2 (two) times daily., Disp: 13 g, Rfl: 5 .  tamsulosin (FLOMAX) 0.4 MG CAPS capsule, Take 2 capsules (0.8 mg total) by mouth daily., Disp: 90 capsule, Rfl: 3  EXAM:  VITALS per patient if applicable:  GENERAL: alert, oriented, appears well and in no acute distress  HEENT: atraumatic, conjunttiva clear,  no obvious abnormalities on inspection of external nose and ears  NECK: normal movements of the head and neck  LUNGS: on inspection no signs of respiratory distress, breathing rate appears normal, no obvious gross SOB, gasping or wheezing  CV: no obvious cyanosis  MS: moves all visible extremities without noticeable abnormality  PSYCH/NEURO: pleasant and cooperative, no obvious depression or anxiety, speech and thought processing grossly intact  ASSESSMENT AND PLAN:  Discussed the following assessment and plan:  COVID-19 infection which was diagnosed last Friday with onset of symptoms around last Thursday.  -We recommend out of work until 08-24-2020.  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  We recommend a full 10 days of quarantine to reduce risk to other coworkers -We refilled his albuterol inhaler to use as needed for any cough or wheezing.  Respiratory symptoms are minimal at this time.     I discussed the assessment and treatment plan with the patient. The patient was provided an opportunity to ask questions and all were answered. The patient agreed with the plan and demonstrated an understanding of the instructions.   The patient was advised to call back or seek an in-person evaluation if the symptoms worsen or if the condition fails to improve as anticipated.     Carolann Littler, MD

## 2020-09-04 ENCOUNTER — Other Ambulatory Visit: Payer: Self-pay

## 2020-09-04 ENCOUNTER — Encounter: Payer: Self-pay | Admitting: Family Medicine

## 2020-09-04 ENCOUNTER — Ambulatory Visit (INDEPENDENT_AMBULATORY_CARE_PROVIDER_SITE_OTHER): Payer: Medicaid Other | Admitting: Family Medicine

## 2020-09-04 VITALS — BP 122/64 | HR 67 | Temp 97.8°F | Ht 67.0 in | Wt 167.3 lb

## 2020-09-04 DIAGNOSIS — Z125 Encounter for screening for malignant neoplasm of prostate: Secondary | ICD-10-CM

## 2020-09-04 DIAGNOSIS — Z0000 Encounter for general adult medical examination without abnormal findings: Secondary | ICD-10-CM

## 2020-09-04 LAB — CBC WITH DIFFERENTIAL/PLATELET
Basophils Absolute: 0 10*3/uL (ref 0.0–0.1)
Basophils Relative: 0.7 % (ref 0.0–3.0)
Eosinophils Absolute: 0.2 10*3/uL (ref 0.0–0.7)
Eosinophils Relative: 5 % (ref 0.0–5.0)
HCT: 40.8 % (ref 39.0–52.0)
Hemoglobin: 13.5 g/dL (ref 13.0–17.0)
Lymphocytes Relative: 39.4 % (ref 12.0–46.0)
Lymphs Abs: 1.7 10*3/uL (ref 0.7–4.0)
MCHC: 33.2 g/dL (ref 30.0–36.0)
MCV: 89.7 fl (ref 78.0–100.0)
Monocytes Absolute: 0.6 10*3/uL (ref 0.1–1.0)
Monocytes Relative: 13 % — ABNORMAL HIGH (ref 3.0–12.0)
Neutro Abs: 1.8 10*3/uL (ref 1.4–7.7)
Neutrophils Relative %: 41.9 % — ABNORMAL LOW (ref 43.0–77.0)
Platelets: 285 10*3/uL (ref 150.0–400.0)
RBC: 4.55 Mil/uL (ref 4.22–5.81)
RDW: 12.5 % (ref 11.5–15.5)
WBC: 4.3 10*3/uL (ref 4.0–10.5)

## 2020-09-04 LAB — PSA: PSA: 5.08 ng/mL — ABNORMAL HIGH (ref 0.10–4.00)

## 2020-09-04 LAB — LIPID PANEL
Cholesterol: 174 mg/dL (ref 0–200)
HDL: 43.5 mg/dL (ref >39.00)
LDL Cholesterol: 112 mg/dL — ABNORMAL HIGH (ref 0–99)
NonHDL: 130.05
Total CHOL/HDL Ratio: 4
Triglycerides: 89 mg/dL (ref 0.0–149.0)
VLDL: 17.8 mg/dL (ref 0.0–40.0)

## 2020-09-04 LAB — BASIC METABOLIC PANEL
BUN: 14 mg/dL (ref 6–23)
CO2: 27 mEq/L (ref 19–32)
Calcium: 8.8 mg/dL (ref 8.4–10.5)
Chloride: 104 mEq/L (ref 96–112)
Creatinine, Ser: 1 mg/dL (ref 0.40–1.50)
GFR: 84 mL/min (ref >60.00)
Glucose, Bld: 86 mg/dL (ref 70–99)
Potassium: 4.2 mEq/L (ref 3.5–5.1)
Sodium: 139 mEq/L (ref 135–145)

## 2020-09-04 LAB — HEPATIC FUNCTION PANEL
ALT: 14 U/L (ref 0–53)
AST: 15 U/L (ref 0–37)
Albumin: 4.2 g/dL (ref 3.5–5.2)
Alkaline Phosphatase: 85 U/L (ref 39–117)
Bilirubin, Direct: 0.2 mg/dL (ref 0.0–0.3)
Total Bilirubin: 1.1 mg/dL (ref 0.2–1.2)
Total Protein: 6.7 g/dL (ref 6.0–8.3)

## 2020-09-04 LAB — TSH: TSH: 1.44 u[IU]/mL (ref 0.35–4.50)

## 2020-09-04 NOTE — Progress Notes (Signed)
Established Patient Office Visit  Subjective:  Patient ID: Raymond Torres, male    DOB: 06/14/1965  Age: 55 y.o. MRN: [id number]  CC:  Chief Complaint  Patient presents with   Annual Exam    No new concerns     HPI Raymond Torres presents for physical exam.  He had recent COVID infection and feels that he is fully COVID recovered at this time.  He has a daughter that just graduated from pharmacy school.  Overall he is doing well.  No specific complaints.  He had remote history of prostate cancer had radiation seed implants several years ago.  No dysuria.  Problem list states history of myocardial infarction.  However, he states he had normal coronaries.  No recent chest pains.  He works out 3 times per week.  Health maintenance reviewed  -No history of shingles vaccine -Has had COVID vaccines along with 1 booster -Colonoscopy up-to-date -Tetanus up-to-date -Gets annual flu vaccine  Social history-married with 3 children.  Never smoked.  Rare alcohol.  Works as a Administrator.  Drives mostly [place] father had prostate cancer but died of complications of lung cancer.  Father was non-smoker.  He has a brother with history of arthritis which sounds like osteoarthritis.  Mother is alive and has hypertension but otherwise well.  No family history of premature CAD or type 2 diabetes   Past Medical History:  Diagnosis Date   Allergic rhinitis    Asthma    followed by pcp   Frequency of urination    History of Helicobacter pylori infection    History of non-ST elevation myocardial infarction (NSTEMI) (01-25-2019  per pt has never any cardiac s&s since 2007)   09-13-2005 thought to be coronary spasm;  per cardiac cath normal coronary arteries, ef 60%, no wall motion abnormality   Hyperlipidemia    Hyperplasia of prostate with lower urinary tract symptoms (LUTS)    Nocturia    Prostate cancer [facility]) urologist--- dr bell/  oncologist--- dr Tammi Klippel    dx 11-29-2018--- Stage T1c,  Gleason 3+3   Wears glasses     Past Surgical History:  Procedure Laterality Date   ACHILLES TENDON REPAIR Left 08/2009   Dr Marlou Sa   CARDIAC CATHETERIZATION  09-13-2005 & 01-04-2006  dr g. taylor   angiographically normal coronary arteries,  ef 60%,  no wall motion abnormality, LVEDP 6 mmHg   PROSTATE BIOPSY  11-20-2018  dr bell office   RADIOACTIVE SEED IMPLANT N/A 01/28/2019   Procedure: RADIOACTIVE SEED IMPLANT/BRACHYTHERAPY IMPLANT;  Surgeon: Lucas Mallow, MD;  Location: [place];  Service: Urology;  Laterality: N/A;   SPACE OAR INSTILLATION N/A 01/28/2019   Procedure: SPACE OAR INSTILLATION;  Surgeon: Lucas Mallow, MD;  [facility]: [facility];  Service: Urology;  Laterality: N/A;    Family History  Problem Relation Age of Onset   Hypertension Mother    Cancer Father        lung and prostate   Prostate cancer Father    Arthritis Brother    Prostate cancer Maternal Uncle    Prostate cancer Paternal Uncle    Colon cancer Neg Hx     Social History   Socioeconomic History   Marital status: Married    Spouse name: Not on file   Number of children: 3   Years of education: Not on file   Highest education level: Not on file  Occupational History  Occupation: Best boy: [employer]  Tobacco Use   Smoking status: Never   Smokeless tobacco: Never  Vaping Use   Vaping Use: Never used  Substance and Sexual Activity   Alcohol use: Yes    Comment: occasional   Drug use: Never   Sexual activity: Yes  Other Topics Concern   Not on file  Social History Narrative   Works as Freight forwarder at [employer]   3 children ages 32 (daugter), 28 (daughter) and 76 (son)   Social Determinants of Health   Financial Resource Strain: Not on file  Food Insecurity: Not on file  Transportation Needs: Not on file  Physical Activity: Not on file  Stress: Not on file  Social Connections: Not on file   Intimate Partner Violence: Not on file    Outpatient Medications Prior to Visit  Medication Sig Dispense Refill   albuterol (VENTOLIN HFA) 108 (90 Base) MCG/ACT inhaler Inhale 2 puffs into the lungs every 4 (four) hours as needed for wheezing or shortness of breath. 8 g 1   tamsulosin (FLOMAX) 0.4 MG CAPS capsule Take 2 capsules (0.8 mg total) by mouth daily. 90 capsule 3   mometasone-formoterol (DULERA) 100-5 MCG/ACT AERO Inhale 2 puffs into the lungs 2 (two) times daily. 13 g 5   No facility-administered medications prior to visit.    Allergies  Allergen Reactions   Shellfish Allergy Hives    Shrimp, crabs    ROS Review of Systems  Constitutional:  Negative for activity change, appetite change, fatigue and fever.  HENT:  Negative for congestion, ear pain and trouble swallowing.   Eyes:  Negative for pain and visual disturbance.  Respiratory:  Negative for cough, shortness of breath and wheezing.   Cardiovascular:  Negative for chest pain and palpitations.  Gastrointestinal:  Negative for abdominal distention, abdominal pain, blood in stool, constipation, diarrhea, nausea, rectal pain and vomiting.  Genitourinary:  Negative for dysuria, hematuria and testicular pain.  Musculoskeletal:  Negative for arthralgias and joint swelling.  Skin:  Negative for rash.  Neurological:  Negative for dizziness, syncope and headaches.  Hematological:  Negative for adenopathy.  Psychiatric/Behavioral:  Negative for confusion and dysphoric mood.      Objective:    Physical Exam Constitutional:      General: He is not in acute distress.    Appearance: He is well-developed.  HENT:     Head: Normocephalic and atraumatic.     Right Ear: External ear normal.     Left Ear: External ear normal.  Eyes:     Conjunctiva/sclera: Conjunctivae normal.     Pupils: Pupils are equal, round, and reactive to light.  Neck:     Thyroid: No thyromegaly.  Cardiovascular:     Rate and Rhythm: Normal rate  and regular rhythm.     Heart sounds: Normal heart sounds. No murmur heard. Pulmonary:     Effort: No respiratory distress.     Breath sounds: No wheezing or rales.  Abdominal:     General: Bowel sounds are normal. There is no distension.     Palpations: Abdomen is soft. There is no mass.     Tenderness: There is no abdominal tenderness. There is no guarding or rebound.  Musculoskeletal:     Cervical back: Normal range of motion and neck supple.  Lymphadenopathy:     Cervical: No cervical adenopathy.  Skin:    Findings: No rash.  Neurological:     Mental Status: He is  alert and oriented to person, place, and time.     Cranial Nerves: No cranial nerve deficit.    BP 122/64 (BP Location: Left Arm, Patient Position: Sitting, Cuff Size: Normal)   Pulse 67   Temp 97.8 F (36.6 C) (Oral)   Ht 5\' 7"  (1.702 m)   Wt 167 lb 4.8 oz (75.9 kg)   SpO2 98%   BMI 26.20 kg/m  Wt Readings from Last 3 Encounters:  09/04/20 167 lb 4.8 oz (75.9 kg)  07/21/20 170 lb 6.4 oz (77.3 kg)  12/03/19 171 lb 6.4 oz (77.7 kg)     Health Maintenance Due  Topic Date Due   HIV Screening  Never done   Zoster Vaccines- Shingrix (1 of 2) Never done   COVID-19 Vaccine (3 - Pfizer risk series) 07/24/2019    There are no preventive care reminders to display for this patient.  Lab Results  Component Value Date   TSH 0.99 04/09/2019   Lab Results  Component Value Date   WBC 4.6 09/04/2019   HGB 13.3 09/04/2019   HCT 40.7 09/04/2019   MCV 90.5 09/04/2019   PLT 286.0 09/04/2019   Lab Results  Component Value Date   NA 139 04/09/2019   K 4.0 04/09/2019   CO2 26 04/09/2019   GLUCOSE 86 04/09/2019   BUN 18 04/09/2019   CREATININE 1.11 04/09/2019   BILITOT 1.1 04/09/2019   ALKPHOS 85 04/09/2019   AST 29 04/09/2019   ALT 20 04/09/2019   PROT 6.9 04/09/2019   ALBUMIN 4.2 04/09/2019   CALCIUM 9.1 04/09/2019   ANIONGAP 11 01/24/2019   GFR 83.12 04/09/2019   Lab Results  Component Value Date    CHOL 180 09/04/2019   Lab Results  Component Value Date   HDL 41.20 09/04/2019   Lab Results  Component Value Date   LDLCALC 117 (H) 09/04/2019   Lab Results  Component Value Date   TRIG 106.0 09/04/2019   Lab Results  Component Value Date   CHOLHDL 4 09/04/2019   Lab Results  Component Value Date   HGBA1C 5.5 04/09/2019      Assessment & Plan:   Problem List Items Addressed This Visit   None Visit Diagnoses     Physical exam    -  Primary   Relevant Orders   Basic metabolic panel   Lipid panel   CBC with Differential/Platelet   TSH   Hepatic function panel   PSA     Generally healthy 55 year old male.  Does have remote history of prostate cancer.  We recommend the following health maintenance items  -Labs as above including PSA -Continue with annual flu vaccine -Did discuss Shingrix vaccine and he will check on insurance coverage and be in touch if interested -We will need repeat colonoscopy in a couple years -Continue regular exercise with goal of 150 minutes of moderate intensity exercise per week  No orders of the defined types were placed in this encounter.   Follow-up: No follow-ups on file.    Carolann Littler, MD

## 2020-09-16 ENCOUNTER — Ambulatory Visit: Payer: Medicaid Other | Admitting: Family Medicine

## 2020-12-13 ENCOUNTER — Emergency Department (HOSPITAL_BASED_OUTPATIENT_CLINIC_OR_DEPARTMENT_OTHER): Payer: Medicaid Other

## 2020-12-13 ENCOUNTER — Emergency Department (HOSPITAL_BASED_OUTPATIENT_CLINIC_OR_DEPARTMENT_OTHER)
Admission: EM | Admit: 2020-12-13 | Discharge: 2020-12-13 | Disposition: A | Discharge status: Home / Self Care | Payer: Medicaid Other | Attending: Emergency Medicine | Admitting: Emergency Medicine

## 2020-12-13 ENCOUNTER — Encounter (HOSPITAL_BASED_OUTPATIENT_CLINIC_OR_DEPARTMENT_OTHER): Payer: Self-pay | Admitting: Emergency Medicine

## 2020-12-13 ENCOUNTER — Other Ambulatory Visit: Payer: Self-pay

## 2020-12-13 DIAGNOSIS — Z20822 Contact with and (suspected) exposure to covid-19: Secondary | ICD-10-CM | POA: Insufficient documentation

## 2020-12-13 DIAGNOSIS — J45909 Unspecified asthma, uncomplicated: Secondary | ICD-10-CM | POA: Insufficient documentation

## 2020-12-13 DIAGNOSIS — Z8546 Personal history of malignant neoplasm of prostate: Secondary | ICD-10-CM | POA: Insufficient documentation

## 2020-12-13 DIAGNOSIS — R079 Chest pain, unspecified: Secondary | ICD-10-CM | POA: Insufficient documentation

## 2020-12-13 DIAGNOSIS — R0602 Shortness of breath: Secondary | ICD-10-CM | POA: Insufficient documentation

## 2020-12-13 LAB — RESP PANEL BY RT-PCR (FLU A&B, COVID) ARPGX2
Influenza A by PCR: NEGATIVE
Influenza B by PCR: NEGATIVE
SARS Coronavirus 2 by RT PCR: NEGATIVE

## 2020-12-13 LAB — CBC
HCT: 42.4 % (ref 39.0–52.0)
Hemoglobin: 14.1 g/dL (ref 13.0–17.0)
MCH: 29.6 pg (ref 26.0–34.0)
MCHC: 33.3 g/dL (ref 30.0–36.0)
MCV: 89.1 fL (ref 80.0–100.0)
Platelets: 279 10*3/uL (ref 150–400)
RBC: 4.76 MIL/uL (ref 4.22–5.81)
RDW: 12.2 % (ref 11.5–15.5)
WBC: 8.9 10*3/uL (ref 4.0–10.5)
nRBC: 0 % (ref 0.0–0.2)

## 2020-12-13 LAB — BASIC METABOLIC PANEL
Anion gap: 8 (ref 5–15)
BUN: 11 mg/dL (ref 6–20)
CO2: 24 mmol/L (ref 22–32)
Calcium: 8.7 mg/dL — ABNORMAL LOW (ref 8.9–10.3)
Chloride: 104 mmol/L (ref 98–111)
Creatinine, Ser: 0.93 mg/dL (ref 0.61–1.24)
GFR, Estimated: >60 mL/min (ref >60)
Glucose, Bld: 106 mg/dL — ABNORMAL HIGH (ref 70–99)
Potassium: 3.8 mmol/L (ref 3.5–5.1)
Sodium: 136 mmol/L (ref 135–145)

## 2020-12-13 LAB — TROPONIN I (HIGH SENSITIVITY)
Troponin I (High Sensitivity): 5 ng/L (ref <18)
Troponin I (High Sensitivity): 7 ng/L (ref <18)

## 2020-12-13 MED ORDER — IOHEXOL 350 MG/ML SOLN
100.0000 mL | Freq: Once | INTRAVENOUS | Status: AC | PRN
  Administered 2020-12-13: 100 mL via INTRAVENOUS

## 2020-12-13 MED ORDER — KETOROLAC TROMETHAMINE 15 MG/ML IJ SOLN
30.0000 mg | Freq: Once | INTRAMUSCULAR | Status: AC
  Administered 2020-12-13: 30 mg via INTRAVENOUS
  Filled 2020-12-13: qty 2

## 2020-12-13 NOTE — Discharge Instructions (Addendum)
Your work-up was reassuring today.  Your blood work and EKG showed no signs of acute heart attack.  You also did not have a blood clot in your lungs.  We currently do not have an explanation for your chest pain.  I am attaching a cardiology office for you to follow-up with.  Please call their office in the morning and tell them about your emergency room visit and schedule an appointment for further evaluation.  If you experience worsening chest pain, difficulty catching your breath, dizziness, syncope or a productive cough or fevers or chills you should come back to the emergency department.  It was a pleasure to meet you both today and I hope that you continue to feel better.

## 2020-12-13 NOTE — ED Triage Notes (Signed)
Pt arrives pov with driver, reports mid sternal CP and shob that awakened patient this am around 0800. Pt denies n/v, endorses radiation to back

## 2020-12-13 NOTE — ED Provider Notes (Addendum)
[place] EMERGENCY DEPARTMENT Provider Note   CSN: [id number] Arrival date & time: 12/13/20  1121     History Chief Complaint  Patient presents with   Chest Pain    Raymond Torres is a 55 y.o. male past medical history asthma, prostate cancer and NSTEMI 2007 presenting today with a complaint of sudden onset chest pain.  Patient reports that earlier this morning he was woken up with severe chest pain and shortness of breath.  He began to have chills and back pain at the same time.  States that the pain did not radiate anywhere.  No dizziness or syncope.  Wife rubbed his back to see if the pain would resolve however it did not.  Patient reports that he continues to have aching chest pain at this time.  Does not feel like the chest pain is radiating through to his back, however has back pain in addition. Denies history of HTN, DVT or PE. No recent long travel or surgery. Normal heart cath in 2007.  Past Medical History:  Diagnosis Date   Allergic rhinitis    Asthma    followed by pcp   Frequency of urination    History of Helicobacter pylori infection    History of non-ST elevation myocardial infarction (NSTEMI) (01-25-2019  per pt has never any cardiac s&s since 2007)   09-13-2005 thought to be coronary spasm;  per cardiac cath normal coronary arteries, ef 60%, no wall motion abnormality   Hyperlipidemia    Hyperplasia of prostate with lower urinary tract symptoms (LUTS)    Nocturia    Prostate cancer [facility]) urologist--- dr bell/  oncologist--- dr Tammi Klippel   dx 11-29-2018--- Stage T1c,  Gleason 3+3   Wears glasses     Patient Active Problem List   Diagnosis Date Noted   Prostate cancer ([place]) 12/14/2018   Preventative health care 08/27/2012   External hemorrhoid 06/22/2012   Microscopic hematuria 08/25/2011   Anemia 08/25/2011   ASTHMA 42/68/3419   HELICOBACTER PYLORI GASTRITIS 05/23/2007   MYOCARDIAL INFARCTION, HX OF 05/23/2007   ALLERGIC RHINITIS 05/23/2007     Past Surgical History:  Procedure Laterality Date   ACHILLES TENDON REPAIR Left 08/2009   Dr Marlou Sa   CARDIAC CATHETERIZATION  09-13-2005 & 01-04-2006  dr g. taylor   angiographically normal coronary arteries,  ef 60%,  no wall motion abnormality, LVEDP 6 mmHg   PROSTATE BIOPSY  11-20-2018  dr bell office   RADIOACTIVE SEED IMPLANT N/A 01/28/2019   Procedure: RADIOACTIVE SEED IMPLANT/BRACHYTHERAPY IMPLANT;  Surgeon: Lucas Mallow, MD;  Location: [place];  Service: Urology;  Laterality: N/A;   SPACE OAR INSTILLATION N/A 01/28/2019   Procedure: SPACE OAR INSTILLATION;  Surgeon: Lucas Mallow, MD;  Location: [facility];  Service: Urology;  Laterality: N/A;       Family History  Problem Relation Age of Onset   Hypertension Mother    Cancer Father        lung and prostate   Prostate cancer Father    Arthritis Brother    Prostate cancer Maternal Uncle    Prostate cancer Paternal Uncle    Colon cancer Neg Hx     Social History   Tobacco Use   Smoking status: Never   Smokeless tobacco: Never  Vaping Use   Vaping Use: Never used  Substance Use Topics   Alcohol use: Yes    Comment: occasional   Drug use: Never  Home Medications Prior to Admission medications   Medication Sig Start Date End Date Taking? Authorizing Provider  albuterol (VENTOLIN HFA) 108 (90 Base) MCG/ACT inhaler Inhale 2 puffs into the lungs every 4 (four) hours as needed for wheezing or shortness of breath. 08/19/20   Burchette, Alinda Sierras, MD  tamsulosin (FLOMAX) 0.4 MG CAPS capsule Take 2 capsules (0.8 mg total) by mouth daily. 02/24/20   Burchette, Alinda Sierras, MD    Allergies    Shellfish allergy  Review of Systems   Review of Systems  Constitutional:  Positive for chills. Negative for diaphoresis and fever.  Eyes:  Negative for visual disturbance.  Respiratory:  Positive for shortness of breath. Negative for cough.   Cardiovascular:  Positive for chest pain.   Musculoskeletal:  Positive for back pain. Negative for neck stiffness.  All other systems reviewed and are negative.  Physical Exam Updated Vital Signs BP 110/77   Pulse 75   Temp 98.6 F (37 C) (Oral)   Resp 20   Ht 5\' 7"  (1.702 m)   Wt 74.8 kg   SpO2 100%   BMI 25.84 kg/m   Physical Exam Vitals and nursing note reviewed.  Constitutional:      General: He is not in acute distress.    Appearance: Normal appearance. He is well-developed.  HENT:     Head: Normocephalic and atraumatic.  Eyes:     General: No scleral icterus.    Conjunctiva/sclera: Conjunctivae normal.  Cardiovascular:     Rate and Rhythm: Normal rate and regular rhythm.     Pulses:          Dorsalis pedis pulses are 2+ on the right side and 2+ on the left side.     Heart sounds: Normal heart sounds. Heart sounds not distant. No murmur heard. Pulmonary:     Effort: Pulmonary effort is normal. No tachypnea or respiratory distress.     Breath sounds: Normal breath sounds.  Abdominal:     Palpations: Abdomen is soft.     Tenderness: There is no abdominal tenderness. There is no guarding.  Skin:    General: Skin is warm and dry.     Findings: No rash.  Neurological:     Mental Status: He is alert.  Psychiatric:        Mood and Affect: Mood normal.    ED Results / Procedures / Treatments   Labs (all labs ordered are listed, but only abnormal results are displayed) Labs Reviewed  BASIC METABOLIC PANEL - Abnormal; Notable for the following components:      Result Value   Glucose, Bld 106 (*)    Calcium 8.7 (*)    All other components within normal limits  RESP PANEL BY RT-PCR (FLU A&B, COVID) ARPGX2  CBC  TROPONIN I (HIGH SENSITIVITY)  TROPONIN I (HIGH SENSITIVITY)    EKG EKG Interpretation  Date/Time:  Sunday December 13 2020 11:29:26 EDT Ventricular Rate:  85 PR Interval:  148 QRS Duration: 100 QT Interval:  360 QTC Calculation: 428 R Axis:   19 Text Interpretation: Normal sinus rhythm  Possible Anterior infarct , age undetermined Abnormal ECG  Nonspecific changes in comparison to prior Confirmed by Gareth Morgan [id number]) on 12/13/2020 12:32:38 PM  Radiology CT Angio Chest PE W and/or Wo Contrast  Result Date: 12/13/2020 CLINICAL DATA:  Chest pain, shortness of breath, PE suspected EXAM: CT ANGIOGRAPHY CHEST WITH CONTRAST TECHNIQUE: Multidetector CT imaging of the chest was performed using the standard protocol during bolus  administration of intravenous contrast. Multiplanar CT image reconstructions and MIPs were obtained to evaluate the vascular anatomy. CONTRAST:  152mL OMNIPAQUE IOHEXOL 350 MG/ML SOLN COMPARISON:  None. FINDINGS: Cardiovascular: Satisfactory opacification of the pulmonary arteries to the segmental level. No evidence of pulmonary embolism. Normal heart size. No pericardial effusion. Mediastinum/Nodes: No enlarged mediastinal, hilar, or axillary lymph nodes. Thyroid gland, trachea, and esophagus demonstrate no significant findings. Lungs/Pleura: Diffuse bilateral bronchial wall thickening bibasilar scarring and or atelectasis. No pleural effusion or pneumothorax. Upper Abdomen: No acute abnormality. Musculoskeletal: No chest wall abnormality. No acute or significant osseous findings. Review of the MIP images confirms the above findings. IMPRESSION: 1. Negative examination for pulmonary embolism. 2. Diffuse bilateral bronchial wall thickening, consistent with nonspecific infectious or inflammatory bronchitis. Electronically Signed   By: Eddie Candle M.D.   On: 12/13/2020 16:10   DG Chest Portable 1 View  Result Date: 12/13/2020 CLINICAL DATA:  Chest pain EXAM: PORTABLE CHEST 1 VIEW COMPARISON:  01/10/2019 FINDINGS: The heart size and mediastinal contours are within normal limits. Both lungs are clear. The visualized skeletal structures are unremarkable. IMPRESSION: No acute abnormality of the lungs. Electronically Signed   By: Eddie Candle M.D.   On: 12/13/2020 12:30     Procedures Procedures   Medications Ordered in ED Medications  ketorolac (TORADOL) 15 MG/ML injection 30 mg (has no administration in time range)    ED Course  I have reviewed the triage vital signs and the nursing notes.  Pertinent labs & imaging results that were available during my care of the patient were reviewed by me and considered in my medical decision making (see chart for details).    MDM Rules/Calculators/A&P The emergent differential diagnosis of chest pain includes: Acute coronary syndrome, pericarditis, aortic dissection, pulmonary embolism, tension pneumothorax, and esophageal rupture. I do not believe the patient has an emergent cause of chest pain, other urgent/non-acute considerations include, but are not limited to: chronic angina, aortic stenosis, cardiomyopathy, myocarditis, mitral valve prolapse, pulmonary hypertension, hypertrophic obstructive cardiomyopathy (HOCM), aortic insufficiency, right ventricular hypertrophy, pneumonia, pleuritis, bronchitis, tumor, gastroesophageal reflux disease (GERD), esophageal spasm, Mallory-Weiss syndrome, peptic ulcer disease.  All of these were considered throughout the evaluation of this patient.  Patient was evaluated by me at bedside.  In no acute distress.  Reports chest pain feels more like pressure over his sternum.  Currently not short of breath.  Chest x-ray normal.  No signs of pneumothorax or hemothorax or other acute process.  EKG normal sinus with nonspecific changes from his previous EKG confirmed by MD Schlossman.  Patient without signs of dissection, vital signs stable and BP equal in both arms.  Blood pressure within normal limits, no concern for hypertensive emergency.  Heart rate normal with regular rhythm.  No murmurs suspicious for mechanical processes causing this chest pain.  Patient's heart score is 4.  Troponin negative x2. BMP and CBC within normal limits.  Patient reports pain decreased with Toradol.  COVID and  flu test negative. Because patient reported increased SOB with ambulation. CTA ordered my MD Schlossman.  This revealed normal pulmonary embolism however did call for potential bronchitis.  At this point I discussed the patient with Dr. Darl Householder who advised me to use shared decision making on whether to admit the patient to [facility] or discharged home with close follow-up.  Patient and his wife were given time to discuss this and ultimately requests discharge home with close follow-up.  Cardiology office has been attached to his discharge papers  as well as information about nonspecific chest pain.  We discussed reasons to return to the emergency department he and his wife report understanding.  Will use his albuterol inhaler prn for bronchitis. Patient stable, ambulatory and appropriate for discharge with close follow-up.  Final Clinical Impression(s) / ED Diagnoses Final diagnoses:  Chest pain, unspecified type    Rx / DC Orders Results and diagnoses were explained to the patient. Return precautions discussed in full. Patient had no additional questions and expressed complete understanding.     Rhae Hammock, PA-C 12/13/20 1643    Rhae Hammock, PA-C 12/13/20 1656    Gareth Morgan, MD 12/14/20 [phone]

## 2021-01-01 ENCOUNTER — Ambulatory Visit: Payer: 59 | Admitting: Family Medicine

## 2021-01-01 ENCOUNTER — Other Ambulatory Visit: Payer: Self-pay

## 2021-01-01 VITALS — BP 118/70 | HR 60 | Temp 98.2°F | Wt 171.2 lb

## 2021-01-01 DIAGNOSIS — C61 Malignant neoplasm of prostate: Secondary | ICD-10-CM

## 2021-01-01 DIAGNOSIS — Z23 Encounter for immunization: Secondary | ICD-10-CM | POA: Diagnosis not present

## 2021-01-01 DIAGNOSIS — R972 Elevated prostate specific antigen [PSA]: Secondary | ICD-10-CM | POA: Diagnosis not present

## 2021-01-01 LAB — PSA: PSA: 3.93 ng/mL (ref 0.10–4.00)

## 2021-01-01 NOTE — Progress Notes (Signed)
Established Patient Office Visit  Subjective:  Patient ID: Raymond Torres, male    DOB: Aug 09, 1965  Age: 55 y.o. MRN: [id number]  CC:  Chief Complaint  Patient presents with   Follow-up    Follow up labs    HPI Raymond Torres presents for follow-up for elevated PSA.  He had PSA of 5.08 back in June.  He does have history of prostate cancer.  This was treated with radiation back in 2020.  Denies any obstructive urinary symptoms.  Strong family history of prostate cancer in his father and 2 uncles.  Denies any recent acute prostatitis symptoms.  No recent appetite or weight changes.  Patient requesting flu vaccine today.  Past Medical History:  Diagnosis Date   Allergic rhinitis    Asthma    followed by pcp   Frequency of urination    History of Helicobacter pylori infection    History of non-ST elevation myocardial infarction (NSTEMI) (01-25-2019  per pt has never any cardiac s&s since 2007)   09-13-2005 thought to be coronary spasm;  per cardiac cath normal coronary arteries, ef 60%, no wall motion abnormality   Hyperlipidemia    Hyperplasia of prostate with lower urinary tract symptoms (LUTS)    Nocturia    Prostate cancer [facility]) urologist--- dr bell/  oncologist--- dr Tammi Klippel   dx 11-29-2018--- Stage T1c,  Gleason 3+3   Wears glasses     Past Surgical History:  Procedure Laterality Date   ACHILLES TENDON REPAIR Left 08/2009   Dr Marlou Sa   CARDIAC CATHETERIZATION  09-13-2005 & 01-04-2006  dr g. taylor   angiographically normal coronary arteries,  ef 60%,  no wall motion abnormality, LVEDP 6 mmHg   PROSTATE BIOPSY  11-20-2018  dr bell office   RADIOACTIVE SEED IMPLANT N/A 01/28/2019   Procedure: RADIOACTIVE SEED IMPLANT/BRACHYTHERAPY IMPLANT;  Surgeon: Lucas Mallow, MD;  Location: [place];  Service: Urology;  Laterality: N/A;   SPACE OAR INSTILLATION N/A 01/28/2019   Procedure: SPACE OAR INSTILLATION;  Surgeon: Lucas Mallow, MD;  Location: [facility];  Service: Urology;  Laterality: N/A;    Family History  Problem Relation Age of Onset   Hypertension Mother    Cancer Father        lung and prostate   Prostate cancer Father    Arthritis Brother    Prostate cancer Maternal Uncle    Prostate cancer Paternal Uncle    Colon cancer Neg Hx     Social History   Socioeconomic History   Marital status: Married    Spouse name: Not on file   Number of children: 3   Years of education: Not on file   Highest education level: Not on file  Occupational History   Occupation: Best boy: [employer]  Tobacco Use   Smoking status: Never   Smokeless tobacco: Never  Vaping Use   Vaping Use: Never used  Substance and Sexual Activity   Alcohol use: Yes    Comment: occasional   Drug use: Never   Sexual activity: Yes  Other Topics Concern   Not on file  Social History Narrative   Works as Freight forwarder at [employer]   3 children ages 62 (daugter), 18 (daughter) and 37 (son)   Social Determinants of Health   Financial Resource Strain: Not on file  Food Insecurity: Not on file  Transportation Needs: Not on file  Physical Activity: Not on  file  Stress: Not on file  Social Connections: Not on file  Intimate Partner Violence: Not on file    Outpatient Medications Prior to Visit  Medication Sig Dispense Refill   albuterol (VENTOLIN HFA) 108 (90 Base) MCG/ACT inhaler Inhale 2 puffs into the lungs every 4 (four) hours as needed for wheezing or shortness of breath. 8 g 1   tamsulosin (FLOMAX) 0.4 MG CAPS capsule Take 2 capsules (0.8 mg total) by mouth daily. 90 capsule 3   No facility-administered medications prior to visit.    Allergies  Allergen Reactions   Shellfish Allergy Hives    Shrimp, crabs    ROS Review of Systems  Constitutional:  Negative for appetite change and unexpected weight change.  Respiratory:  Negative for cough and shortness of breath.   Genitourinary:  Negative for  difficulty urinating, dysuria and hematuria.     Objective:    Physical Exam Vitals reviewed.  Constitutional:      Appearance: Normal appearance.  Cardiovascular:     Rate and Rhythm: Normal rate and regular rhythm.  Pulmonary:     Effort: Pulmonary effort is normal.     Breath sounds: Normal breath sounds.  Genitourinary:    Comments: Prostate is mildly enlarged.  Nontender.  No asymmetry.  No rectal masses. Neurological:     Mental Status: He is alert.    BP 118/70 (BP Location: Left Arm, Patient Position: Sitting, Cuff Size: Normal)   Pulse 60   Temp 98.2 F (36.8 C) (Oral)   Wt 171 lb 3.2 oz (77.7 kg)   SpO2 97%   BMI 26.81 kg/m  Wt Readings from Last 3 Encounters:  01/01/21 171 lb 3.2 oz (77.7 kg)  12/13/20 165 lb (74.8 kg)  09/04/20 167 lb 4.8 oz (75.9 kg)     Health Maintenance Due  Topic Date Due   HIV Screening  Never done   Zoster Vaccines- Shingrix (1 of 2) Never done   COVID-19 Vaccine (3 - Pfizer risk series) 07/24/2019   INFLUENZA VACCINE  10/19/2020    There are no preventive care reminders to display for this patient.  Lab Results  Component Value Date   TSH 1.44 09/04/2020   Lab Results  Component Value Date   WBC 8.9 12/13/2020   HGB 14.1 12/13/2020   HCT 42.4 12/13/2020   MCV 89.1 12/13/2020   PLT 279 12/13/2020   Lab Results  Component Value Date   NA 136 12/13/2020   K 3.8 12/13/2020   CO2 24 12/13/2020   GLUCOSE 106 (H) 12/13/2020   BUN 11 12/13/2020   CREATININE 0.93 12/13/2020   BILITOT 1.1 09/04/2020   ALKPHOS 85 09/04/2020   AST 15 09/04/2020   ALT 14 09/04/2020   PROT 6.7 09/04/2020   ALBUMIN 4.2 09/04/2020   CALCIUM 8.7 (L) 12/13/2020   ANIONGAP 8 12/13/2020   GFR 84.00 09/04/2020   Lab Results  Component Value Date   CHOL 174 09/04/2020   Lab Results  Component Value Date   HDL 43.50 09/04/2020   Lab Results  Component Value Date   LDLCALC 112 (H) 09/04/2020   Lab Results  Component Value Date    TRIG 89.0 09/04/2020   Lab Results  Component Value Date   CHOLHDL 4 09/04/2020   Lab Results  Component Value Date   HGBA1C 5.5 04/09/2019      Assessment & Plan:   Recent bump in PSA in a patient with history of prostate cancer treated with radiation  therapy 2020  -Repeat PSA today.  If still up we will get back into see urology. -Flu vaccine given   No orders of the defined types were placed in this encounter.   Follow-up: No follow-ups on file.    Carolann Littler, MD

## 2021-01-01 NOTE — Addendum Note (Signed)
Addended by: Amanda Cockayne on: 01/01/2021 08:48 AM   Modules accepted: Orders

## 2021-01-21 ENCOUNTER — Other Ambulatory Visit: Payer: Self-pay

## 2021-01-21 DIAGNOSIS — Z973 Presence of spectacles and contact lenses: Secondary | ICD-10-CM | POA: Insufficient documentation

## 2021-01-21 DIAGNOSIS — R351 Nocturia: Secondary | ICD-10-CM | POA: Insufficient documentation

## 2021-01-21 DIAGNOSIS — N401 Enlarged prostate with lower urinary tract symptoms: Secondary | ICD-10-CM | POA: Insufficient documentation

## 2021-01-21 DIAGNOSIS — Z8619 Personal history of other infectious and parasitic diseases: Secondary | ICD-10-CM | POA: Insufficient documentation

## 2021-01-21 DIAGNOSIS — R350 Frequency of micturition: Secondary | ICD-10-CM | POA: Insufficient documentation

## 2021-01-25 ENCOUNTER — Ambulatory Visit: Payer: Medicaid Other | Admitting: Cardiology

## 2021-09-07 ENCOUNTER — Encounter: Payer: 59 | Admitting: Family Medicine

## 2021-09-10 ENCOUNTER — Ambulatory Visit (INDEPENDENT_AMBULATORY_CARE_PROVIDER_SITE_OTHER): Payer: 59 | Admitting: Family Medicine

## 2021-09-10 ENCOUNTER — Encounter: Payer: Self-pay | Admitting: Family Medicine

## 2021-09-10 VITALS — BP 122/82 | HR 75 | Temp 97.6°F | Ht 67.32 in | Wt 173.6 lb

## 2021-09-10 DIAGNOSIS — Z0000 Encounter for general adult medical examination without abnormal findings: Secondary | ICD-10-CM

## 2021-09-10 LAB — CBC WITH DIFFERENTIAL/PLATELET
Basophils Absolute: 0 10*3/uL (ref 0.0–0.1)
Basophils Relative: 0.8 % (ref 0.0–3.0)
Eosinophils Absolute: 0.3 10*3/uL (ref 0.0–0.7)
Eosinophils Relative: 7.2 % — ABNORMAL HIGH (ref 0.0–5.0)
HCT: 42.1 % (ref 39.0–52.0)
Hemoglobin: 13.9 g/dL (ref 13.0–17.0)
Lymphocytes Relative: 47.2 % — ABNORMAL HIGH (ref 12.0–46.0)
Lymphs Abs: 2.3 10*3/uL (ref 0.7–4.0)
MCHC: 32.9 g/dL (ref 30.0–36.0)
MCV: 91.1 fl (ref 78.0–100.0)
Monocytes Absolute: 0.6 10*3/uL (ref 0.1–1.0)
Monocytes Relative: 13.5 % — ABNORMAL HIGH (ref 3.0–12.0)
Neutro Abs: 1.5 10*3/uL (ref 1.4–7.7)
Neutrophils Relative %: 31.3 % — ABNORMAL LOW (ref 43.0–77.0)
Platelets: 268 10*3/uL (ref 150.0–400.0)
RBC: 4.62 Mil/uL (ref 4.22–5.81)
RDW: 13.1 % (ref 11.5–15.5)
WBC: 4.8 10*3/uL (ref 4.0–10.5)

## 2021-09-10 LAB — TSH: TSH: 1.63 u[IU]/mL (ref 0.35–5.50)

## 2021-09-10 LAB — LIPID PANEL
Cholesterol: 176 mg/dL (ref 0–200)
HDL: 45.7 mg/dL (ref >39.00)
LDL Cholesterol: 109 mg/dL — ABNORMAL HIGH (ref 0–99)
NonHDL: 130.38
Total CHOL/HDL Ratio: 4
Triglycerides: 109 mg/dL (ref 0.0–149.0)
VLDL: 21.8 mg/dL (ref 0.0–40.0)

## 2021-09-10 LAB — BASIC METABOLIC PANEL
BUN: 10 mg/dL (ref 6–23)
CO2: 29 mEq/L (ref 19–32)
Calcium: 8.9 mg/dL (ref 8.4–10.5)
Chloride: 104 mEq/L (ref 96–112)
Creatinine, Ser: 0.97 mg/dL (ref 0.40–1.50)
GFR: 86.51 mL/min (ref >60.00)
Glucose, Bld: 91 mg/dL (ref 70–99)
Potassium: 4.2 mEq/L (ref 3.5–5.1)
Sodium: 139 mEq/L (ref 135–145)

## 2021-09-10 LAB — HEPATIC FUNCTION PANEL
ALT: 18 U/L (ref 0–53)
AST: 22 U/L (ref 0–37)
Albumin: 3.9 g/dL (ref 3.5–5.2)
Alkaline Phosphatase: 80 U/L (ref 39–117)
Bilirubin, Direct: 0.2 mg/dL (ref 0.0–0.3)
Total Bilirubin: 0.9 mg/dL (ref 0.2–1.2)
Total Protein: 6.4 g/dL (ref 6.0–8.3)

## 2021-09-10 LAB — PSA: PSA: 2.24 ng/mL (ref 0.10–4.00)

## 2022-02-18 ENCOUNTER — Encounter: Payer: Self-pay | Admitting: Family Medicine

## 2022-02-18 ENCOUNTER — Ambulatory Visit (INDEPENDENT_AMBULATORY_CARE_PROVIDER_SITE_OTHER): Payer: BC Managed Care – PPO | Admitting: Family Medicine

## 2022-02-18 VITALS — BP 128/80 | HR 76 | Temp 97.5°F | Ht 67.32 in | Wt 178.6 lb

## 2022-02-18 DIAGNOSIS — R972 Elevated prostate specific antigen [PSA]: Secondary | ICD-10-CM | POA: Diagnosis not present

## 2022-02-18 LAB — PSA: PSA: 0.79 ng/mL (ref 0.10–4.00)

## 2022-02-18 NOTE — Progress Notes (Signed)
Established Patient Office Visit  Subjective   Patient ID: Raymond Torres, male    DOB: [birth year]  Age: 56 y.o. MRN: [id number]  Chief Complaint  Patient presents with   Follow-up    HPI   [place] seen for medical follow-up.  Physical 6 months ago.  He has history of elevated PSA.  Occasional slow stream.  No recent burning with urination.  Sometimes gets up once or twice at night to urinate but not consistently.  Last PSA is actually down some.  He would like to repeat this today.  Also needs flu vaccine.  Initial blood pressure up today.  No history of hypertension.  No recent headaches or dizziness.  No peripheral edema.  Past Medical History:  Diagnosis Date   Allergic rhinitis    Asthma    followed by pcp   Frequency of urination    History of Helicobacter pylori infection    History of non-ST elevation myocardial infarction (NSTEMI) (01-25-2019  per pt has never any cardiac s&s since 2007)   09-13-2005 thought to be coronary spasm;  per cardiac cath normal coronary arteries, ef 60%, no wall motion abnormality   Hyperlipidemia    Hyperplasia of prostate with lower urinary tract symptoms (LUTS)    Nocturia    Prostate cancer [facility]) urologist--- dr bell/  oncologist--- dr Tammi Klippel   dx 11-29-2018--- Stage T1c,  Gleason 3+3   Wears glasses    Past Surgical History:  Procedure Laterality Date   ACHILLES TENDON REPAIR Left 08/2009   Dr Marlou Sa   CARDIAC CATHETERIZATION  09-13-2005 & 01-04-2006  dr g. taylor   angiographically normal coronary arteries,  ef 60%,  no wall motion abnormality, LVEDP 6 mmHg   PROSTATE BIOPSY  11-20-2018  dr bell office   RADIOACTIVE SEED IMPLANT N/A 01/28/2019   Procedure: RADIOACTIVE SEED IMPLANT/BRACHYTHERAPY IMPLANT;  Surgeon: Lucas Mallow, MD;  Location: [place];  Service: Urology;  Laterality: N/A;   SPACE OAR INSTILLATION N/A 01/28/2019   Procedure: SPACE OAR INSTILLATION;  Surgeon: Lucas Mallow, MD;  Location:  [facility];  Service: Urology;  Laterality: N/A;    reports that he has never smoked. He has never used smokeless tobacco. He reports current alcohol use. He reports that he does not use drugs. family history includes Arthritis in his brother; Cancer in his father; Hypertension in his mother; Prostate cancer in his father, maternal uncle, and paternal uncle. Allergies  Allergen Reactions   Shellfish Allergy Hives    Shrimp, crabs    Review of Systems  Constitutional:  Negative for malaise/fatigue.  Eyes:  Negative for blurred vision.  Respiratory:  Negative for shortness of breath.   Cardiovascular:  Negative for chest pain.  Neurological:  Negative for dizziness, weakness and headaches.      Objective:     BP 128/80 (BP Location: Left Arm, Cuff Size: Normal)   Pulse 76   Temp (!) 97.5 F (36.4 C) (Oral)   Ht 5' 7.32" (1.71 m)   Wt 178 lb 9.6 oz (81 kg)   SpO2 97%   BMI 27.71 kg/m    Physical Exam Vitals reviewed.  Constitutional:      Appearance: He is well-developed.  Eyes:     Pupils: Pupils are equal, round, and reactive to light.  Neck:     Thyroid: No thyromegaly.  Cardiovascular:     Rate and Rhythm: Normal rate and regular rhythm.  Musculoskeletal:  Cervical back: Neck supple.  Neurological:     Mental Status: He is alert and oriented to person, place, and time.      No results found for any visits on 02/18/22.    The ASCVD Risk score (Arnett DK, et al., 2019) failed to calculate for the following reasons:   The patient has a prior MI or stroke diagnosis    Assessment & Plan:   Problem List Items Addressed This Visit   None Visit Diagnoses     Elevated PSA    -  Primary   Relevant Orders   PSA     History of elevated PSA.  This had actually come down with last labs 6 months ago.  We gave him option of repeating today and he would like to do so.  Recheck PSA Suspect he has some BPH  Repeat blood pressure after rest  improved  Flu vaccine given  Set up 20-monthfollow-up for physical  No follow-ups on file.    BCarolann Littler MD

## 2022-05-13 ENCOUNTER — Encounter: Payer: Self-pay | Admitting: Family Medicine

## 2022-05-13 ENCOUNTER — Ambulatory Visit (INDEPENDENT_AMBULATORY_CARE_PROVIDER_SITE_OTHER): Payer: BC Managed Care – PPO | Admitting: Family Medicine

## 2022-05-13 VITALS — BP 124/84 | HR 75 | Temp 97.9°F | Ht 67.32 in | Wt 175.7 lb

## 2022-05-13 DIAGNOSIS — H8111 Benign paroxysmal vertigo, right ear: Secondary | ICD-10-CM | POA: Diagnosis not present

## 2022-05-13 NOTE — Progress Notes (Signed)
Established Patient Office Visit  Subjective   Patient ID: Raymond Torres, male    DOB: [birth year]  Age: 57 y.o. MRN: [id number]  Chief Complaint  Patient presents with   Dizziness    Patient complains of dizziness, x2 days    HPI   Raymond Torres is seen with chief complaint of dizziness.  He first noticed this Wednesday when he went to lay down on his lifting bench at home.  He then had some dizziness again with sitting forward.  He described this as vertigo.  No syncopal or presyncopal symptoms.  He has had some intermittent episodes of vertigo since then.  No visual changes.  No tinnitus or hearing changes.  He had some headaches couple days ago but none today.  No nausea or vomiting.  No ataxia.  No focal weakness.  Denies prior history of vertigo.  Takes no regular medications.  Past Medical History:  Diagnosis Date   Allergic rhinitis    Asthma    followed by pcp   Frequency of urination    History of Helicobacter pylori infection    History of non-ST elevation myocardial infarction (NSTEMI) (01-25-2019  per pt has never any cardiac s&s since 2007)   09-13-2005 thought to be coronary spasm;  per cardiac cath normal coronary arteries, ef 60%, no wall motion abnormality   Hyperlipidemia    Hyperplasia of prostate with lower urinary tract symptoms (LUTS)    Nocturia    Prostate cancer [facility]) urologist--- dr bell/  oncologist--- dr Tammi Klippel   dx 11-29-2018--- Stage T1c,  Gleason 3+3   Wears glasses    Past Surgical History:  Procedure Laterality Date   ACHILLES TENDON REPAIR Left 08/2009   Dr Marlou Sa   CARDIAC CATHETERIZATION  09-13-2005 & 01-04-2006  dr g. taylor   angiographically normal coronary arteries,  ef 60%,  no wall motion abnormality, LVEDP 6 mmHg   PROSTATE BIOPSY  11-20-2018  dr bell office   RADIOACTIVE SEED IMPLANT N/A 01/28/2019   Procedure: RADIOACTIVE SEED IMPLANT/BRACHYTHERAPY IMPLANT;  Surgeon: Lucas Mallow, MD;  Location: [place];  Service:  Urology;  Laterality: N/A;   SPACE OAR INSTILLATION N/A 01/28/2019   Procedure: SPACE OAR INSTILLATION;  Surgeon: Lucas Mallow, MD;  Location: [facility];  Service: Urology;  Laterality: N/A;    reports that he has never smoked. He has never used smokeless tobacco. He reports current alcohol use. He reports that he does not use drugs. family history includes Arthritis in his brother; Cancer in his father; Hypertension in his mother; Prostate cancer in his father, maternal uncle, and paternal uncle. Allergies  Allergen Reactions   Shellfish Allergy Hives    Shrimp, crabs    Review of Systems  Constitutional:  Negative for chills, fever and weight loss.  HENT:  Negative for hearing loss.   Eyes:  Negative for blurred vision and double vision.  Respiratory:  Negative for sputum production.   Cardiovascular:  Negative for chest pain.  Genitourinary:  Negative for dysuria.  Neurological:  Positive for dizziness. Negative for speech change, focal weakness, seizures, loss of consciousness, weakness and headaches.      Objective:     BP 124/84 (BP Location: Left Arm, Cuff Size: Normal)   Pulse 75   Temp 97.9 F (36.6 C) (Oral)   Ht 5' 7.32" (1.71 m)   Wt 175 lb 11.2 oz (79.7 kg)   SpO2 99%   BMI 27.26 kg/m  BP  Readings from Last 3 Encounters:  05/13/22 124/84  02/18/22 128/80  09/10/21 122/82   Wt Readings from Last 3 Encounters:  05/13/22 175 lb 11.2 oz (79.7 kg)  02/18/22 178 lb 9.6 oz (81 kg)  09/10/21 173 lb 9.6 oz (78.7 kg)      Physical Exam Vitals reviewed.  Constitutional:      Appearance: Normal appearance.  HENT:     Head: Normocephalic and atraumatic.  Eyes:     Pupils: Pupils are equal, round, and reactive to light.  Cardiovascular:     Rate and Rhythm: Normal rate and regular rhythm.  Pulmonary:     Effort: Pulmonary effort is normal.     Breath sounds: Normal breath sounds.  Neurological:     General: No focal deficit present.      Mental Status: He is alert.     Cranial Nerves: No cranial nerve deficit.     Motor: No weakness.     Gait: Gait normal.     Comments: No focal weakness.  No ataxia.  No gait disturbance.  He had mild symptoms when turning head to the right side 45 degrees and lying backwards and then sitting forward but not to the left      No results found for any visits on 05/13/22.  Last CBC Lab Results  Component Value Date   WBC 4.8 09/10/2021   HGB 13.9 09/10/2021   HCT 42.1 09/10/2021   MCV 91.1 09/10/2021   MCH 29.6 12/13/2020   RDW 13.1 09/10/2021   PLT 268.0 123XX123   Last metabolic panel Lab Results  Component Value Date   GLUCOSE 91 09/10/2021   NA 139 09/10/2021   K 4.2 09/10/2021   CL 104 09/10/2021   CO2 29 09/10/2021   BUN 10 09/10/2021   CREATININE 0.97 09/10/2021   GFRNONAA >60 12/13/2020   CALCIUM 8.9 09/10/2021   PROT 6.4 09/10/2021   ALBUMIN 3.9 09/10/2021   BILITOT 0.9 09/10/2021   ALKPHOS 80 09/10/2021   AST 22 09/10/2021   ALT 18 09/10/2021   ANIONGAP 8 12/13/2020   Last lipids Lab Results  Component Value Date   CHOL 176 09/10/2021   HDL 45.70 09/10/2021   LDLCALC 109 (H) 09/10/2021   TRIG 109.0 09/10/2021   CHOLHDL 4 09/10/2021   Last thyroid functions Lab Results  Component Value Date   TSH 1.63 09/10/2021      The ASCVD Risk score (Arnett DK, et al., 2019) failed to calculate for the following reasons:   The patient has a prior MI or stroke diagnosis    Assessment & Plan:   Intermittent vertigo.  Suspect benign peripheral positional vertigo to the right.  Nonfocal neuroexam.  No nystagmus noted at this time.  No focal weakness.  -Discussed trial of Epley maneuvers with handout given -Discussed red flags to watch out for with vertigo -Follow-up for any persistent or worsening symptoms -Discussed limited role of medications for treating vertigo   Carolann Littler, MD

## 2022-07-05 ENCOUNTER — Telehealth: Payer: Self-pay | Admitting: Family Medicine

## 2022-07-05 NOTE — Telephone Encounter (Signed)
Prescription Request  07/05/2022  LOV: 05/13/2022  What is the name of the medication or equipment? albuterol (VENTOLIN HFA) 108 (90 Base) MCG/ACT inhaler . Pt rx expired  Have you contacted your pharmacy to request a refill? Yes   Which pharmacy would you like this sent to?   [facility] [address], [place] - [address] [place] [address] Phone: [phone] Fax: [phone]    Patient notified that their request is being sent to the clinical staff for review and that they should receive a response within 2 business days.   Please advise at Mobile [phone] (mobile)

## 2022-07-06 MED ORDER — ALBUTEROL SULFATE HFA 108 (90 BASE) MCG/ACT IN AERS: 2.0000 | INHALATION_SPRAY | RESPIRATORY_TRACT | 1 refills | Status: DC | PRN

## 2022-07-06 NOTE — Telephone Encounter (Signed)
Rx sent 

## 2022-09-16 ENCOUNTER — Encounter: Payer: Self-pay | Admitting: Family Medicine

## 2022-09-16 ENCOUNTER — Ambulatory Visit (INDEPENDENT_AMBULATORY_CARE_PROVIDER_SITE_OTHER): Payer: BC Managed Care – PPO | Admitting: Family Medicine

## 2022-09-16 VITALS — BP 120/80 | HR 66 | Temp 97.7°F | Ht 66.14 in | Wt 176.5 lb

## 2022-09-16 DIAGNOSIS — Z0000 Encounter for general adult medical examination without abnormal findings: Secondary | ICD-10-CM

## 2022-09-16 DIAGNOSIS — Z8042 Family history of malignant neoplasm of prostate: Secondary | ICD-10-CM | POA: Diagnosis not present

## 2022-09-16 DIAGNOSIS — Z1211 Encounter for screening for malignant neoplasm of colon: Secondary | ICD-10-CM

## 2022-09-16 DIAGNOSIS — Z125 Encounter for screening for malignant neoplasm of prostate: Secondary | ICD-10-CM | POA: Diagnosis not present

## 2022-09-16 LAB — CBC WITH DIFFERENTIAL/PLATELET
Basophils Absolute: 0.1 10*3/uL (ref 0.0–0.1)
Basophils Relative: 1.2 % (ref 0.0–3.0)
Eosinophils Absolute: 0.3 10*3/uL (ref 0.0–0.7)
Eosinophils Relative: 6.3 % — ABNORMAL HIGH (ref 0.0–5.0)
HCT: 42.8 % (ref 39.0–52.0)
Hemoglobin: 13.7 g/dL (ref 13.0–17.0)
Lymphocytes Relative: 42.8 % (ref 12.0–46.0)
Lymphs Abs: 2.2 10*3/uL (ref 0.7–4.0)
MCHC: 31.9 g/dL (ref 30.0–36.0)
MCV: 91.5 fl (ref 78.0–100.0)
Monocytes Absolute: 0.7 10*3/uL (ref 0.1–1.0)
Monocytes Relative: 13.4 % — ABNORMAL HIGH (ref 3.0–12.0)
Neutro Abs: 1.8 10*3/uL (ref 1.4–7.7)
Neutrophils Relative %: 36.3 % — ABNORMAL LOW (ref 43.0–77.0)
Platelets: 259 10*3/uL (ref 150.0–400.0)
RBC: 4.68 Mil/uL (ref 4.22–5.81)
RDW: 12.4 % (ref 11.5–15.5)
WBC: 5.1 10*3/uL (ref 4.0–10.5)

## 2022-09-16 LAB — HEPATIC FUNCTION PANEL
ALT: 16 U/L (ref 0–53)
AST: 18 U/L (ref 0–37)
Albumin: 4 g/dL (ref 3.5–5.2)
Alkaline Phosphatase: 79 U/L (ref 39–117)
Bilirubin, Direct: 0.1 mg/dL (ref 0.0–0.3)
Total Bilirubin: 0.9 mg/dL (ref 0.2–1.2)
Total Protein: 6.6 g/dL (ref 6.0–8.3)

## 2022-09-16 LAB — BASIC METABOLIC PANEL
BUN: 11 mg/dL (ref 6–23)
CO2: 29 mEq/L (ref 19–32)
Calcium: 9 mg/dL (ref 8.4–10.5)
Chloride: 104 mEq/L (ref 96–112)
Creatinine, Ser: 0.99 mg/dL (ref 0.40–1.50)
GFR: 83.82 mL/min (ref >60.00)
Glucose, Bld: 90 mg/dL (ref 70–99)
Potassium: 4 mEq/L (ref 3.5–5.1)
Sodium: 140 mEq/L (ref 135–145)

## 2022-09-16 LAB — LIPID PANEL
Cholesterol: 168 mg/dL (ref 0–200)
HDL: 40.8 mg/dL (ref >39.00)
LDL Cholesterol: 109 mg/dL — ABNORMAL HIGH (ref 0–99)
NonHDL: 127.63
Total CHOL/HDL Ratio: 4
Triglycerides: 91 mg/dL (ref 0.0–149.0)
VLDL: 18.2 mg/dL (ref 0.0–40.0)

## 2022-09-16 LAB — PSA: PSA: 0.53 ng/mL (ref 0.10–4.00)

## 2022-09-16 NOTE — Progress Notes (Signed)
Established Patient Office Visit  Subjective   Patient ID: Raymond Torres, male    DOB: 06/08/1965  Age: 57 y.o. MRN: [id number]  Chief Complaint  Patient presents with   Annual Exam    HPI   [place] is seen for physical exam.  Generally fairly healthy.  Takes no regular medications.  He brings in forms for foster parenting.  No history of any known communicable diseases.  No specific risk factors for TB.  Health maintenance reviewed  -Due for repeat colonoscopy.  Last one was in [facility] 2014.  No polyps.  No family history of colon cancer. -No history of shingles vaccine -Tetanus due 2027 -Prior hepatitis C screen negative  Social history-married with 3 children.  He has a son age 86 who is still at home.  He has daughters ages 39 and 61.  His oldest daughter is a Teacher, early years/pre .   Rare cigar use.  Rare alcohol.  Works as a Naval architect.  Drives mostly throughout [place]  Family history-his father had prostate cancer but died of complications of lung cancer.  Father was non-smoker.  He has a brother with history of arthritis which sounds like osteoarthritis.  Mother is alive and has hypertension but otherwise well.  No family history of premature CAD or type 2 diabetes   Past Medical History:  Diagnosis Date   Allergic rhinitis    Asthma    followed by pcp   Frequency of urination    History of Helicobacter pylori infection    History of non-ST elevation myocardial infarction (NSTEMI) (01-25-2019  per pt has never any cardiac s&s since 2007)   09-13-2005 thought to be coronary spasm;  per cardiac cath normal coronary arteries, ef 60%, no wall motion abnormality   Hyperlipidemia    Hyperplasia of prostate with lower urinary tract symptoms (LUTS)    Nocturia    Prostate cancer [facility]) urologist--- dr bell/  oncologist--- dr Kathrynn Running   dx 11-29-2018--- Stage T1c,  Gleason 3+3   Wears glasses    Past Surgical History:  Procedure Laterality Date   ACHILLES TENDON REPAIR Left  08/2009   Dr August Saucer   CARDIAC CATHETERIZATION  09-13-2005 & 01-04-2006  dr g. taylor   angiographically normal coronary arteries,  ef 60%,  no wall motion abnormality, LVEDP 6 mmHg   PROSTATE BIOPSY  11-20-2018  dr bell office   RADIOACTIVE SEED IMPLANT N/A 01/28/2019   Procedure: RADIOACTIVE SEED IMPLANT/BRACHYTHERAPY IMPLANT;  Surgeon: Crista Elliot, MD;  Location: [facility];  Service: Urology;  Laterality: N/A;   SPACE OAR INSTILLATION N/A 01/28/2019   Procedure: SPACE OAR INSTILLATION;  Surgeon: Crista Elliot, MD;  Location: [facility];  Service: Urology;  Laterality: N/A;    reports that he has never smoked. He has never used smokeless tobacco. He reports current alcohol use. He reports that he does not use drugs. family history includes Arthritis in his brother; Cancer in his father; Hypertension in his mother; Prostate cancer in his father, maternal uncle, and paternal uncle. Allergies  Allergen Reactions   Shellfish Allergy Hives    Shrimp, crabs     Review of Systems  Constitutional:  Negative for chills, fever, malaise/fatigue and weight loss.  HENT:  Negative for hearing loss.   Eyes:  Negative for blurred vision and double vision.  Respiratory:  Negative for cough and shortness of breath.   Cardiovascular:  Negative for chest pain, palpitations and leg swelling.  Gastrointestinal:  Negative for abdominal pain, blood in stool, constipation and diarrhea.  Genitourinary:  Negative for dysuria.  Skin:  Negative for rash.  Neurological:  Negative for dizziness, speech change, seizures, loss of consciousness and headaches.  Psychiatric/Behavioral:  Negative for depression.       Objective:     BP 120/80 (BP Location: Left Arm, Patient Position: Sitting)   Pulse 66   Temp 97.7 F (36.5 C) (Oral)   Ht 5' 6.14" (1.68 m)   Wt 176 lb 8 oz (80.1 kg)   SpO2 97%   BMI 28.37 kg/m    Physical Exam Vitals reviewed.  Constitutional:       General: He is not in acute distress.    Appearance: He is well-developed.  HENT:     Head: Normocephalic and atraumatic.     Right Ear: External ear normal.     Left Ear: External ear normal.  Eyes:     Conjunctiva/sclera: Conjunctivae normal.     Pupils: Pupils are equal, round, and reactive to light.  Neck:     Thyroid: No thyromegaly.  Cardiovascular:     Rate and Rhythm: Normal rate and regular rhythm.     Heart sounds: Normal heart sounds. No murmur heard. Pulmonary:     Effort: No respiratory distress.     Breath sounds: No wheezing or rales.  Abdominal:     General: Bowel sounds are normal. There is no distension.     Palpations: Abdomen is soft. There is no mass.     Tenderness: There is no abdominal tenderness. There is no guarding or rebound.  Musculoskeletal:     Cervical back: Normal range of motion and neck supple.     Right lower leg: No edema.     Left lower leg: No edema.  Lymphadenopathy:     Cervical: No cervical adenopathy.  Skin:    Findings: No rash.  Neurological:     Mental Status: He is alert and oriented to person, place, and time.     Cranial Nerves: No cranial nerve deficit.      No results found for any visits on 09/16/22.    The ASCVD Risk score (Arnett DK, et al., 2019) failed to calculate for the following reasons:   The patient has a prior MI or stroke diagnosis    Assessment & Plan:   Problem List Items Addressed This Visit   None Visit Diagnoses     Physical exam    -  Primary   Relevant Orders   Basic metabolic panel   Lipid panel   CBC with Differential/Platelet   Hepatic function panel   Prostate cancer screening       Family history of prostate cancer       Relevant Orders   PSA   Colon cancer screening       Relevant Orders   Ambulatory referral to Gastroenterology     57 year old male with no chronic medical problems.  We discussed following health maintenance items  -Continue annual flu vaccine -Set up  repeat colonoscopy -We discussed Shingrix and he will check on insurance coverage first -Obtain follow-up labs as above including PSA especially with his family history of prostate cancer -Recommend try to lose some weight and establish more consistent exercise.  No follow-ups on file.    Evelena Peat, MD

## 2022-09-16 NOTE — Patient Instructions (Signed)
Consider Shingrix (shingles) vaccine.   Would check on insurance coverage first.

## 2023-01-21 IMAGING — DX DG CHEST 1V PORT
1 series · 1 of 1 positions shown · non-contrast
Comparison: 01/10/2019

CLINICAL DATA: Chest pain

EXAM:
PORTABLE CHEST 1 VIEW

[chest ap]
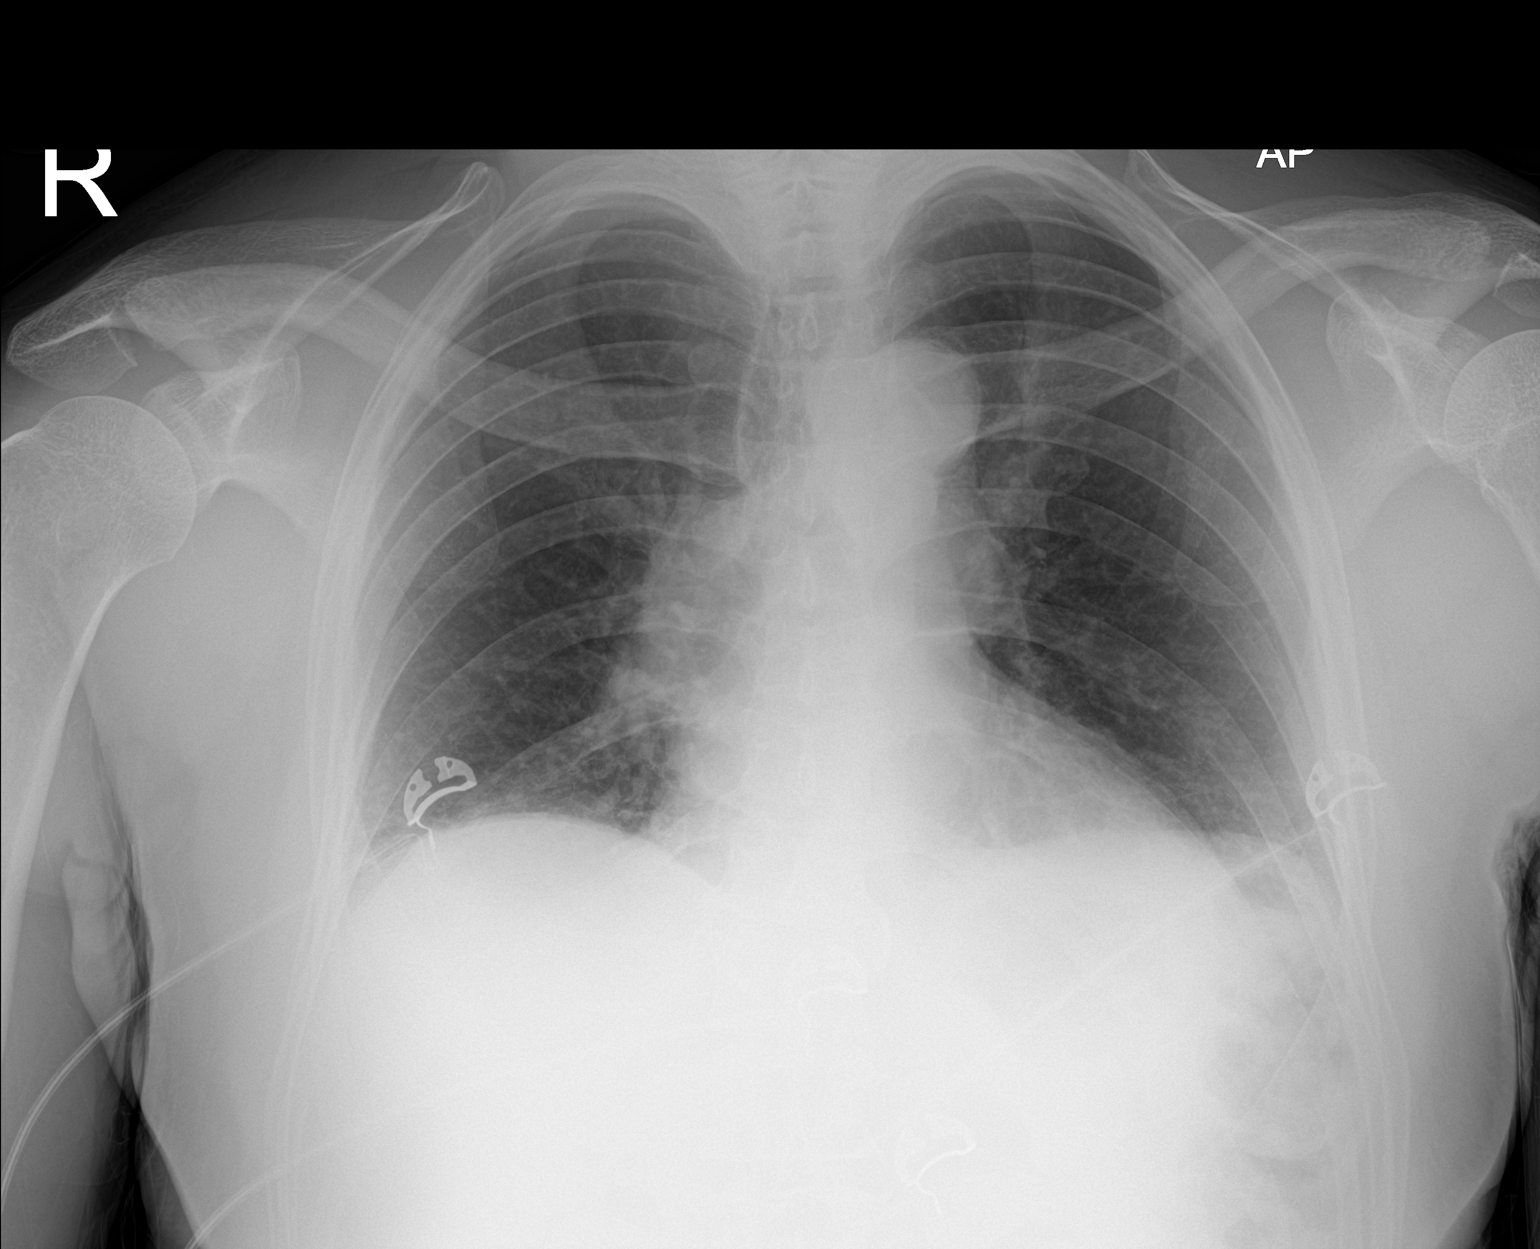

[1 of 1 positions shown; findings below may reference images not displayed]

FINDINGS: The heart size and mediastinal contours are within normal limits.
Both lungs are clear. The visualized skeletal structures are
unremarkable.
IMPRESSION: No acute abnormality of the lungs.

## 2023-01-21 IMAGING — CT CT ANGIO CHEST
2 of 8 series · 19 of 36 positions shown · IV contrast (Omnipaque)
Comparison: None.

CLINICAL DATA: Chest pain, shortness of breath, PE suspected

EXAM:
CT ANGIOGRAPHY CHEST WITH CONTRAST
TECHNIQUE: Multidetector CT imaging of the chest was performed using the
standard protocol during bolus administration of intravenous
contrast. Multiplanar CT image reconstructions and MIPs were
obtained to evaluate the vascular anatomy.
CONTRAST:  100mL OMNIPAQUE IOHEXOL 350 MG/ML SOLN

[Series 6: pe coronal mpr · coronal · 0.54mm/px · 1 of 151 slices shown]
[im 76/151  mediastinal]
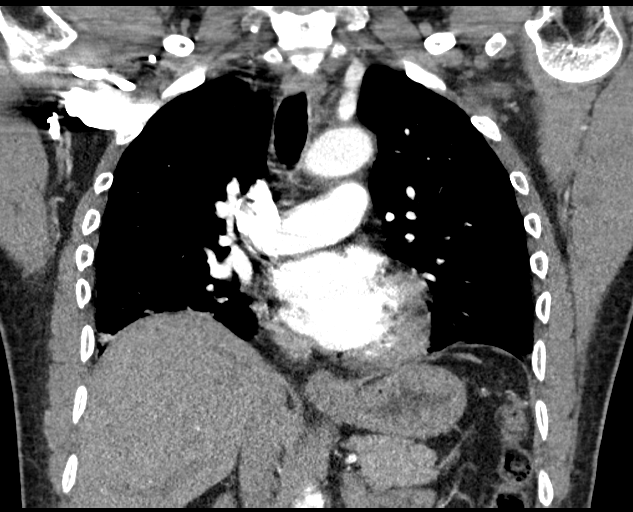

[Series 10: pe thins · axial · 0.71mm/px · z∈[-282,-41]mm · 18 of 271 slices shown]
[im 15/271  lung]
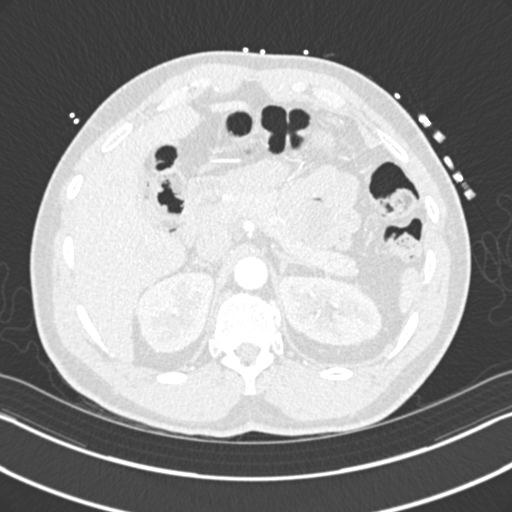
[im 29/271  mediastinal]
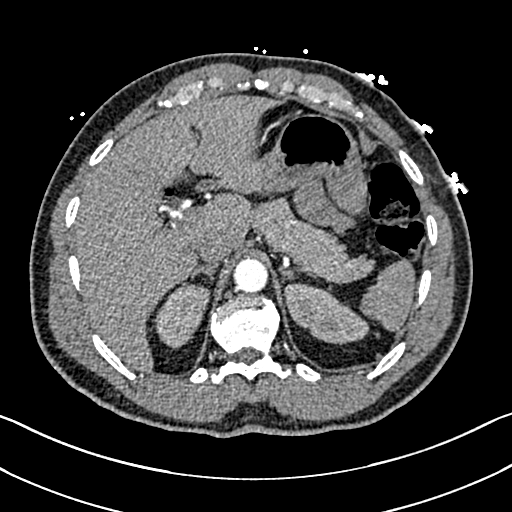
[im 43/271  lung]
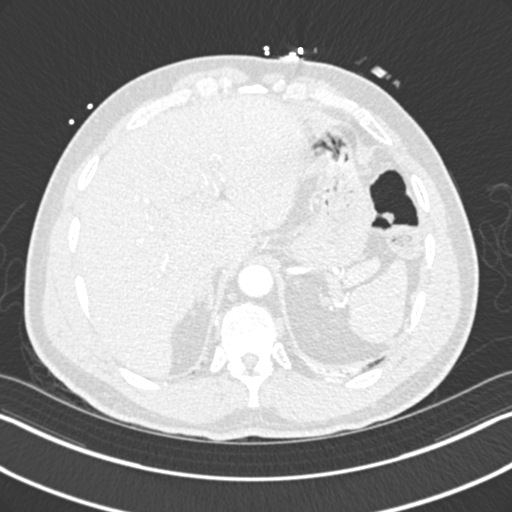
[im 57/271  mediastinal]
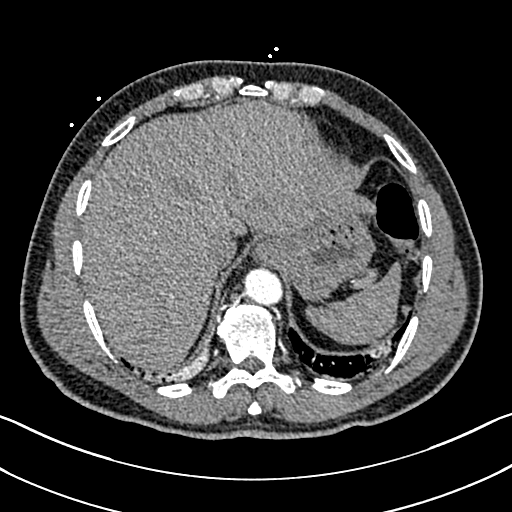
[im 72/271  lung]
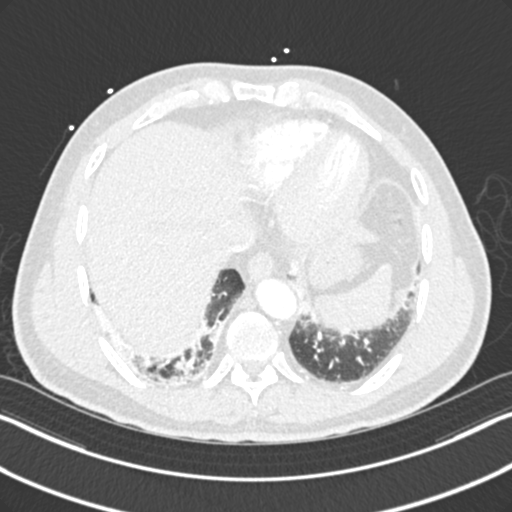
[im 86/271  mediastinal]
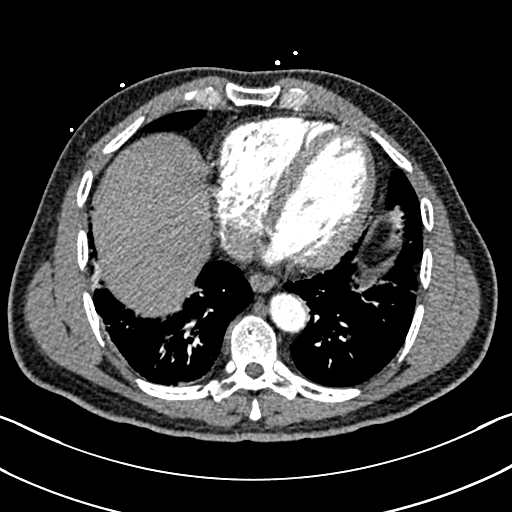
[im 100/271  lung]
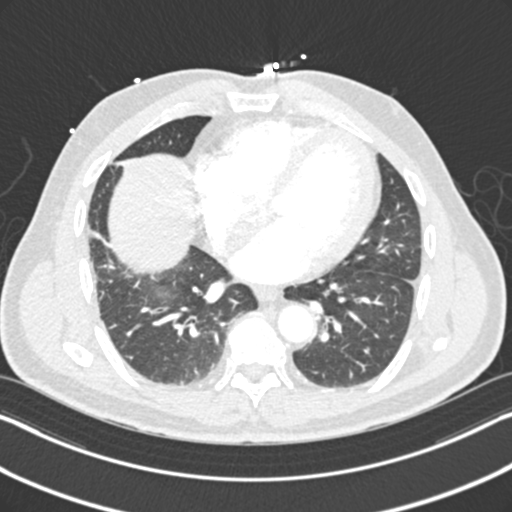
[im 114/271  mediastinal]
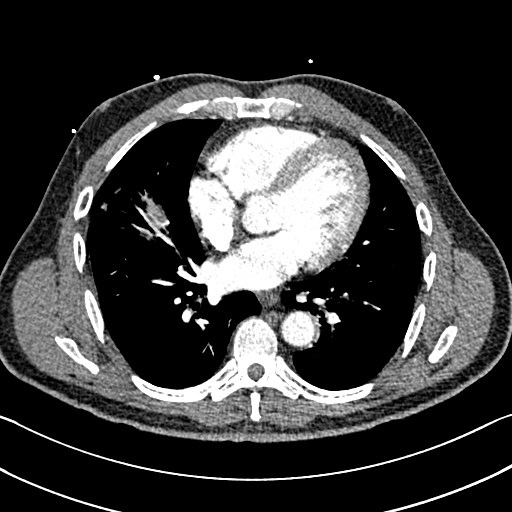
[im 128/271  lung]
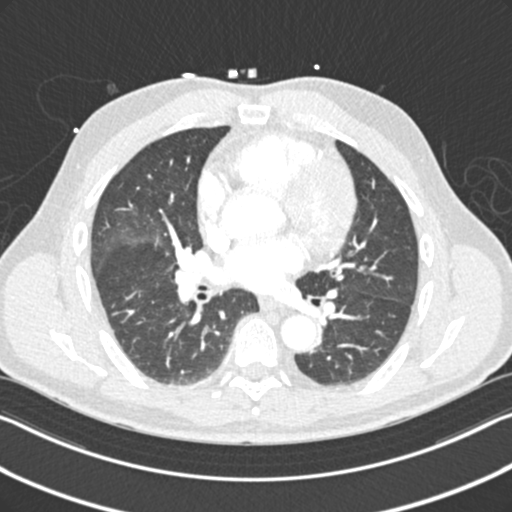
[im 143/271  mediastinal]
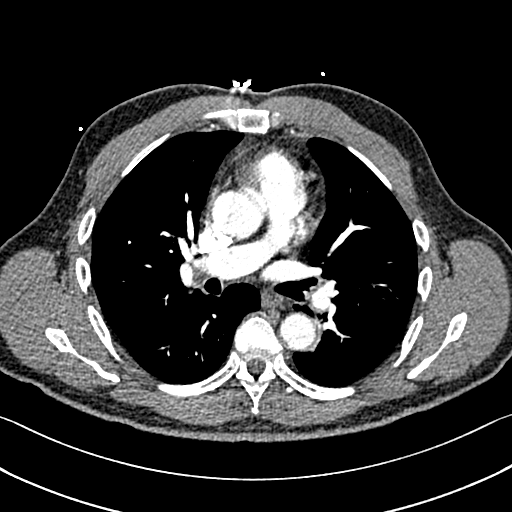
[im 157/271  lung]
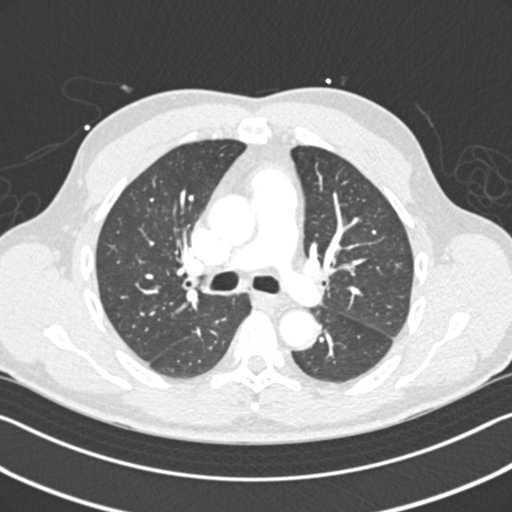
[im 171/271  mediastinal]
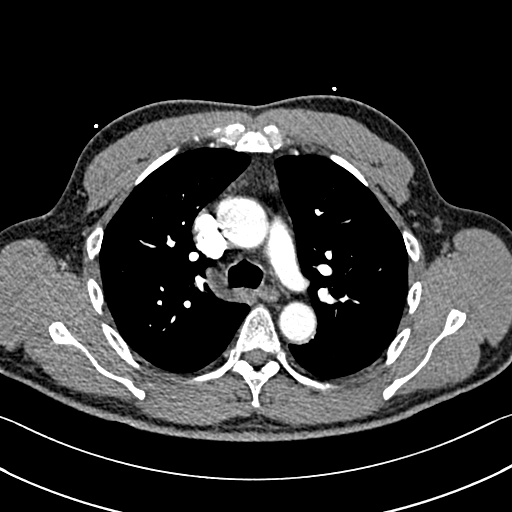
[im 185/271  lung]
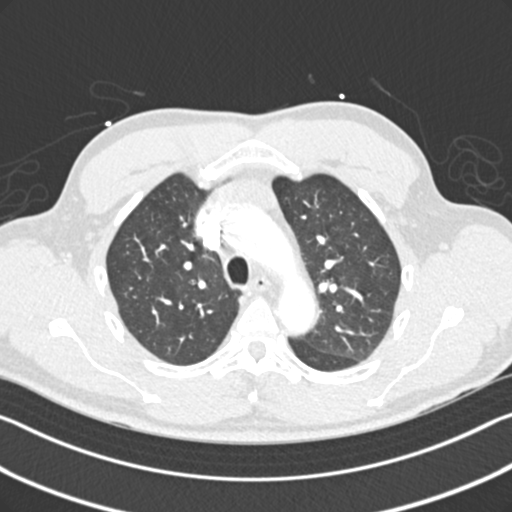
[im 199/271  mediastinal]
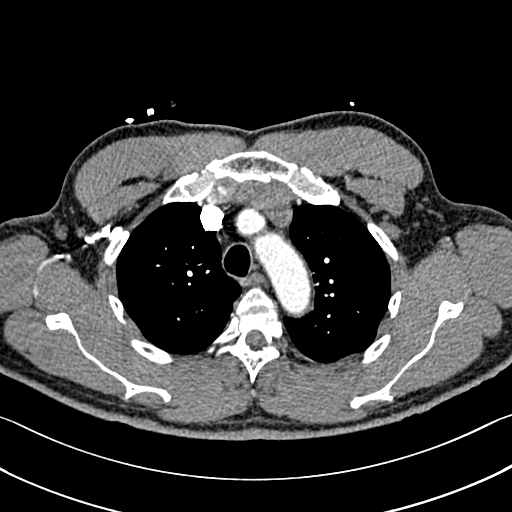
[im 214/271  lung]
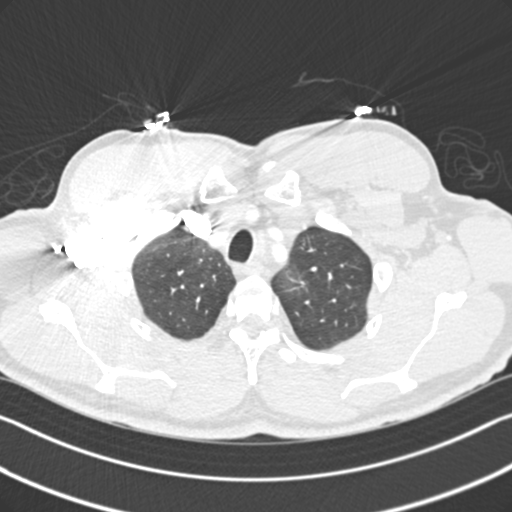
[im 228/271  mediastinal]
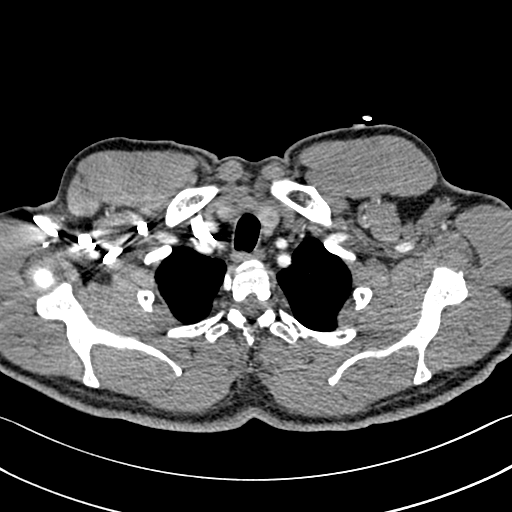
[im 242/271  lung]
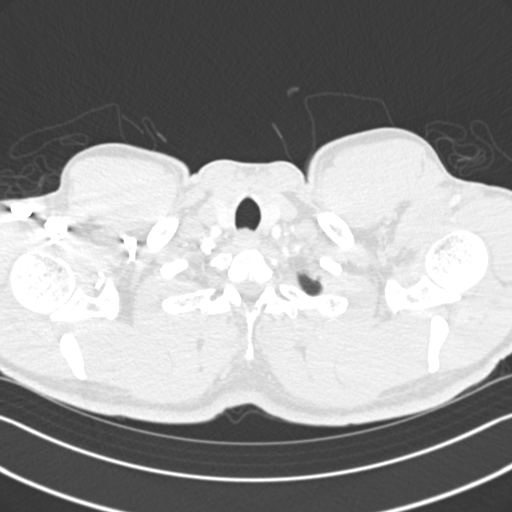
[im 256/271  mediastinal]
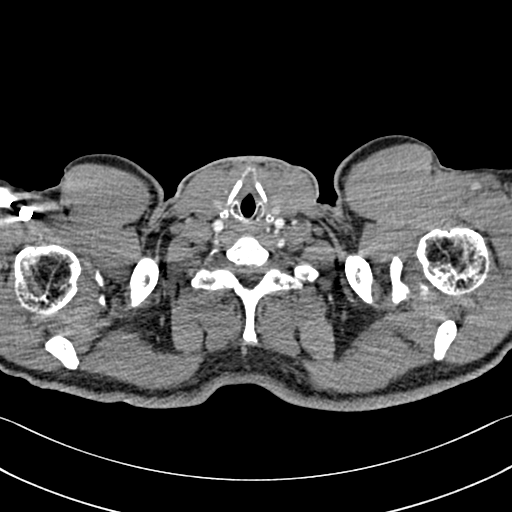

[19 of 36 positions shown; findings below may reference images not displayed]

FINDINGS: Cardiovascular: Satisfactory opacification of the pulmonary arteries
to the segmental level. No evidence of pulmonary embolism. Normal
heart size. No pericardial effusion.

Mediastinum/Nodes: No enlarged mediastinal, hilar, or axillary lymph
nodes. Thyroid gland, trachea, and esophagus demonstrate no
significant findings.

Lungs/Pleura: Diffuse bilateral bronchial wall thickening bibasilar
scarring and or atelectasis. No pleural effusion or pneumothorax.

Upper Abdomen: No acute abnormality.

Musculoskeletal: No chest wall abnormality. No acute or significant
osseous findings.

Review of the MIP images confirms the above findings.
IMPRESSION: 1. Negative examination for pulmonary embolism.
2. Diffuse bilateral bronchial wall thickening, consistent with
nonspecific infectious or inflammatory bronchitis.

## 2023-07-06 ENCOUNTER — Encounter: Payer: Self-pay | Admitting: Gastroenterology

## 2023-07-26 ENCOUNTER — Ambulatory Visit (AMBULATORY_SURGERY_CENTER)

## 2023-07-26 VITALS — Ht 66.5 in | Wt 175.0 lb

## 2023-07-26 DIAGNOSIS — Z1211 Encounter for screening for malignant neoplasm of colon: Secondary | ICD-10-CM

## 2023-07-26 MED ORDER — NA SULFATE-K SULFATE-MG SULF 17.5-3.13-1.6 GM/177ML PO SOLN: 1.0000 | Freq: Once | ORAL | 0 refills | Status: AC

## 2023-07-26 NOTE — Progress Notes (Signed)
 No egg or soy allergy known to patient  No issues known to pt with past sedation with any surgeries or procedures Patient denies ever being told they had issues or difficulty with intubation  No FH of Malignant Hyperthermia Pt is not on diet pills nor GLP-1 medications Pt is not on home 02  Pt is not on blood thinners  Pt denies issues with constipation  No A fib or A flutter Have any cardiac testing pending--no Ambulates independently

## 2023-07-31 ENCOUNTER — Encounter: Payer: Self-pay | Admitting: Gastroenterology

## 2023-08-16 ENCOUNTER — Encounter: Payer: Self-pay | Admitting: Gastroenterology

## 2023-08-16 ENCOUNTER — Ambulatory Visit: Admitting: Gastroenterology

## 2023-08-16 VITALS — BP 133/88 | HR 73 | Temp 97.9°F | Resp 13 | Ht 66.0 in | Wt 175.0 lb

## 2023-08-16 DIAGNOSIS — K640 First degree hemorrhoids: Secondary | ICD-10-CM | POA: Diagnosis not present

## 2023-08-16 DIAGNOSIS — K5730 Diverticulosis of large intestine without perforation or abscess without bleeding: Secondary | ICD-10-CM | POA: Diagnosis not present

## 2023-08-16 DIAGNOSIS — Z1211 Encounter for screening for malignant neoplasm of colon: Secondary | ICD-10-CM

## 2023-08-16 DIAGNOSIS — K6389 Other specified diseases of intestine: Secondary | ICD-10-CM | POA: Diagnosis not present

## 2023-08-16 DIAGNOSIS — K5282 Eosinophilic colitis: Secondary | ICD-10-CM | POA: Diagnosis not present

## 2023-08-16 DIAGNOSIS — K639 Disease of intestine, unspecified: Secondary | ICD-10-CM

## 2023-08-16 MED ORDER — SODIUM CHLORIDE 0.9 % IV SOLN: 500.0000 mL | Freq: Once | INTRAVENOUS | Status: DC

## 2023-08-16 NOTE — Progress Notes (Signed)
 [facility]  Pt's states no medical or surgical changes since previsit or office visit.

## 2023-08-16 NOTE — Progress Notes (Signed)
 Pt's states no medical or surgical changes since previsit or office visit.

## 2023-08-16 NOTE — Progress Notes (Signed)
 [facility] History and Physical   Primary Care Physician:  Raymond Sit, MD   Reason for Procedure:   Colon cancer screening  Plan:    Colon cancer screening     HPI: Raymond Torres is a 58 y.o. male undergoing initial average risk screening colonoscopy.  He has no family history of colon cancer and no chronic GI symptoms. He was seen in our office and scheduled for a colonoscopy in 2013 after a positive fecal occult blood test, but apparently had the procedure performed elsewhere (records not available).  He believes it was normal.  Past Medical History:  Diagnosis Date   Allergic rhinitis    Asthma    followed by pcp   Frequency of urination    History of Helicobacter pylori infection    History of non-ST elevation myocardial infarction (NSTEMI) (01-25-2019  per pt has never any cardiac s&s since 2007)   09-13-2005 thought to be coronary spasm;  per cardiac cath normal coronary arteries, ef 60%, no wall motion abnormality   Hyperlipidemia    Hyperplasia of prostate with lower urinary tract symptoms (LUTS)    Nocturia    Prostate cancer [facility]) urologist--- Raymond bell/  oncologist--- Raymond Lorri Rota   dx 11-29-2018--- Stage T1c,  Gleason 3+3   Wears glasses     Past Surgical History:  Procedure Laterality Date   ACHILLES TENDON REPAIR Left 08/2009   Raymond Torres   CARDIAC CATHETERIZATION  09-13-2005 & 01-04-2006  Raymond Raymond Torres   angiographically normal coronary arteries,  ef 60%,  no wall motion abnormality, LVEDP 6 mmHg   PROSTATE BIOPSY  11-20-2018  Raymond bell office   RADIOACTIVE SEED IMPLANT N/A 01/28/2019   Procedure: RADIOACTIVE SEED IMPLANT/BRACHYTHERAPY IMPLANT;  Surgeon: Raymond Croak, MD;  Location: [facility];  Service: Urology;  Laterality: N/A;   SPACE OAR INSTILLATION N/A 01/28/2019   Procedure: SPACE OAR INSTILLATION;  Surgeon: Raymond Croak, MD;  Location: [facility];  Service: Urology;  Laterality: N/A;    Prior  to Admission medications   Medication Sig Start Date End Date Taking? Authorizing Provider  albuterol  (VENTOLIN  HFA) 108 (90 Base) MCG/ACT inhaler Inhale 2 puffs into the lungs every 4 (four) hours as needed for wheezing or shortness of breath. 07/06/22   Raymond Atta, NP    Current Outpatient Medications  Medication Sig Dispense Refill   albuterol  (VENTOLIN  HFA) 108 (90 Base) MCG/ACT inhaler Inhale 2 puffs into the lungs every 4 (four) hours as needed for wheezing or shortness of breath. 8 g 1   Current Facility-Administered Medications  Medication Dose Route Frequency Provider Last Rate Last Admin   0.9 %  sodium chloride  infusion  500 mL Intravenous Once Raymond Hair, MD        Allergies as of 08/16/2023 - Review Complete 08/16/2023  Allergen Reaction Noted   Shellfish allergy Hives 02/06/2012    Family History  Problem Relation Age of Onset   Hypertension Mother    Cancer Father        lung and prostate   Prostate cancer Father    Arthritis Brother    Prostate cancer Maternal Uncle    Prostate cancer Paternal Uncle    Colon cancer Neg Hx    Esophageal cancer Neg Hx    Colon polyps Neg Hx    Rectal cancer Neg Hx    Stomach cancer Neg Hx     Social History   Socioeconomic History  Marital status: Married    Spouse name: Not on file   Number of children: 3   Years of education: Not on file   Highest education level: Bachelor's degree (e.g., BA, AB, BS)  Occupational History   Occupation: Event organiser: OTHER  Tobacco Use   Smoking status: Some Days    Types: Cigars   Smokeless tobacco: Never  Vaping Use   Vaping status: Never Used  Substance and Sexual Activity   Alcohol use: Yes    Comment: occasional   Drug use: Never   Sexual activity: Yes  Other Topics Concern   Not on file  Social History Narrative   Works as Production designer, theatre/television/film at [employer]   3 children ages 101 (daugter), 23 (daughter) and 37 (son)   Social Drivers of Manufacturing engineer Strain: Low Risk  (05/13/2022)   Overall Financial Resource Strain (CARDIA)    Difficulty of Paying Living Expenses: Not hard at all  Food Insecurity: No Food Insecurity (05/13/2022)   Hunger Vital Sign    Worried About Running Out of Food in the Last Year: Never true    Ran Out of Food in the Last Year: Never true  Transportation Needs: No Transportation Needs (05/13/2022)   PRAPARE - Administrator, Civil Service (Medical): No    Lack of Transportation (Non-Medical): No  Physical Activity: Sufficiently Active (05/13/2022)   Exercise Vital Sign    Days of Exercise per Week: 3 days    Minutes of Exercise per Session: 90 min  Stress: No Stress Concern Present (05/13/2022)   Raymond Torres of Occupational Health - Occupational Stress Questionnaire    Feeling of Stress : Not at all  Social Connections: Moderately Integrated (05/13/2022)   Social Connection and Isolation Panel [NHANES]    Frequency of Communication with Friends and Family: More than three times a week    Frequency of Social Gatherings with Friends and Family: Once a week    Attends Religious Services: 1 to 4 times per year    Active Member of [employer] or Organizations: No    Attends Engineer, structural: Not on file    Marital Status: Married  Catering manager Violence: Not on file    Review of Systems:  All other review of systems negative except as mentioned in the HPI.  Physical Exam: Vital signs BP 134/79   Pulse 70   Temp 97.9 F (36.6 C)   Ht 5\' 6"  (1.676 m)   Wt 175 lb (79.4 kg)   SpO2 98%   BMI 28.25 kg/m   General:   Alert,  Well-developed, well-nourished, pleasant and cooperative in NAD Airway:  Mallampati 2 Lungs:  Clear throughout to auscultation.   Heart:  Regular rate and rhythm; no murmurs, clicks, rubs,  or gallops. Abdomen:  Soft, nontender and nondistended. Normal bowel sounds.   Neuro/Psych:  Normal mood and affect. A and O x 3   Raymond Torres E.  Raymond Corona, MD [facility]

## 2023-08-16 NOTE — Patient Instructions (Signed)
 Resume previous diet Continue present medications Repeat colonoscopy in 10 years for screening purposes See handouts for diverticulosis and hemorrhoids  YOU HAD AN ENDOSCOPIC PROCEDURE TODAY AT THE [facility]:   Refer to the procedure report that was given to you for any specific questions about what was found during the examination.  If the procedure report does not answer your questions, please call your gastroenterologist to clarify.  If you requested that your care partner not be given the details of your procedure findings, then the procedure report has been included in a sealed envelope for you to review at your convenience later.  YOU SHOULD EXPECT: Some feelings of bloating in the abdomen. Passage of more gas than usual.  Walking can help get rid of the air that was put into your GI tract during the procedure and reduce the bloating. If you had a lower endoscopy (such as a colonoscopy or flexible sigmoidoscopy) you may notice spotting of blood in your stool or on the toilet paper. If you underwent a bowel prep for your procedure, you may not have a normal bowel movement for a few days.  Please Note:  You might notice some irritation and congestion in your nose or some drainage.  This is from the oxygen used during your procedure.  There is no need for concern and it should clear up in a day or so.  SYMPTOMS TO REPORT IMMEDIATELY:  Following lower endoscopy (colonoscopy or flexible sigmoidoscopy):  Excessive amounts of blood in the stool  Significant tenderness or worsening of abdominal pains  Swelling of the abdomen that is new, acute  Fever of 100F or higher  For urgent or emergent issues, a gastroenterologist can be reached at any hour by calling [phone]. Do not use MyChart messaging for urgent concerns.   DIET:  We do recommend a small meal at first, but then you may proceed to your regular diet.  Drink plenty of fluids but you should avoid alcoholic beverages  for 24 hours.  ACTIVITY:  You should plan to take it easy for the rest of today and you should NOT DRIVE or use heavy machinery until tomorrow (because of the sedation medicines used during the test).    FOLLOW UP: Our staff will call the number listed on your records the next business day following your procedure.  We will call around 7:15- 8:00 am to check on you and address any questions or concerns that you may have regarding the information given to you following your procedure. If we do not reach you, we will leave a message.     If any biopsies were taken you will be contacted by phone or by letter within the next 1-3 weeks.  Please call us  at [phone] if you have not heard about the biopsies in 3 weeks.   SIGNATURES/CONFIDENTIALITY: You and/or your care partner have signed paperwork which will be entered into your electronic medical record.  These signatures attest to the fact that that the information above on your After Visit Summary has been reviewed and is understood.  Full responsibility of the confidentiality of this discharge information lies with you and/or your care-partner.

## 2023-08-16 NOTE — Progress Notes (Signed)
 Pt sedate, gd SR's, VSS, report to RN

## 2023-08-16 NOTE — Op Note (Signed)
 [facility] Patient Name: Raymond Torres Procedure Date: 08/16/2023 9:02 AM MRN: [id number] Endoscopist: Geralyn Knee E. Cherryl Corona , MD, [id number] Age: 58 Referring MD:  Date of Birth: 1965/11/30 Gender: Male Account #: [id number] Procedure:                Colonoscopy Indications:              Screening for colorectal malignant neoplasm (last                            colonoscopy was more than 10 years ago) Medicines:                Monitored Anesthesia Care Procedure:                Pre-Anesthesia Assessment:                           - Prior to the procedure, a History and Physical                            was performed, and patient medications and                            allergies were reviewed. The patient's tolerance of                            previous anesthesia was also reviewed. The risks                            and benefits of the procedure and the sedation                            options and risks were discussed with the patient.                            All questions were answered, and informed consent                            was obtained. Prior Anticoagulants: The patient has                            taken no anticoagulant or antiplatelet agents. ASA                            Grade Assessment: II - A patient with mild systemic                            disease. After reviewing the risks and benefits,                            the patient was deemed in satisfactory condition to                            undergo the procedure.  After obtaining informed consent, the colonoscope                            was passed under direct vision. Throughout the                            procedure, the patient's blood pressure, pulse, and                            oxygen saturations were monitored continuously. The                            Olympus Scope SN: [id number] was introduced through                            the anus and  advanced to the the cecum, identified                            by appendiceal orifice and ileocecal valve. The                            colonoscopy was performed without difficulty. The                            patient tolerated the procedure well. The quality                            of the bowel preparation was good. The ileocecal                            valve, appendiceal orifice, and rectum were                            photographed. The bowel preparation used was SUPREP                            via split dose instruction. Scope In: 9:16:03 AM Scope Out: 9:30:26 AM Scope Withdrawal Time: 0 hours 9 minutes 55 seconds  Total Procedure Duration: 0 hours 14 minutes 23 seconds  Findings:                 The perianal and digital rectal examinations were                            normal. Pertinent negatives include normal                            sphincter tone and no palpable rectal lesions.                           A few small-mouthed diverticula were found in the                            sigmoid colon.  A patchy area of mildly 'pock-marked' mucosa was                            found in the ascending colon. Biopsies were taken                            with a cold forceps for histology. Estimated blood                            loss was minimal.                           The exam was otherwise normal throughout the                            examined colon.                           Non-bleeding internal hemorrhoids were found during                            retroflexion. The hemorrhoids were Grade I                            (internal hemorrhoids that do not prolapse).                           No additional abnormalities were found on                            retroflexion. Complications:            No immediate complications. Estimated Blood Loss:     Estimated blood loss: none. Impression:               - Mild diverticulosis in the  sigmoid colon.                           - 'Pock-marked' mucosa in the ascending colon.                            Biopsied. Suspect normal variant                           - Non-bleeding internal hemorrhoids. Recommendation:           - Patient has a contact number available for                            emergencies. The signs and symptoms of potential                            delayed complications were discussed with the                            patient. Return to normal activities tomorrow.  Written discharge instructions were provided to the                            patient.                           - Resume previous diet.                           - Continue present medications.                           - Repeat colonoscopy in 10 years for screening                            purposes. Clark Cuff E. Cherryl Corona, MD 08/16/2023 9:40:01 AM This report has been signed electronically.

## 2023-08-16 NOTE — Progress Notes (Signed)
 Patient drank 2 oz of water at 8am. CRNA aware and states thanks ok.

## 2023-08-17 ENCOUNTER — Telehealth: Payer: Self-pay | Admitting: *Deleted

## 2023-08-17 NOTE — Telephone Encounter (Signed)
  Follow up Call-     08/16/2023    8:31 AM 08/16/2023    8:29 AM  Call back number  Post procedure Call Back phone  # [phone]   Permission to leave phone message Yes Yes     Patient questions:  Do you have a fever, pain , or abdominal swelling? No. Pain Score  0 *  Have you tolerated food without any problems? Yes.    Have you been able to return to your normal activities? Yes.    Do you have any questions about your discharge instructions: Diet   No. Medications  No. Follow up visit  No.  Do you have questions or concerns about your Care? No.  Actions: * If pain score is 4 or above: No action needed, pain <4.

## 2023-08-18 LAB — SURGICAL PATHOLOGY

## 2023-08-22 ENCOUNTER — Ambulatory Visit: Payer: Self-pay | Admitting: Gastroenterology

## 2023-08-22 DIAGNOSIS — K529 Noninfective gastroenteritis and colitis, unspecified: Secondary | ICD-10-CM

## 2023-08-22 NOTE — Progress Notes (Signed)
 Raymond Torres,  The biopsies of the abnormal appearing portions of your colon showed elevated elevated levels of eosinophils, which is a  type of white blood cell involved in a variety of immune reactions (drug reaction/allergy, parasitic infection, autoimmune).  This finding is not specific for any diagnosis, and if you are not having any chronic gastrointestinal symptoms such as abdominal pain, diarrhea or weight loss, it is likely nothing that needs to be treated or followed up.  I would like to get a blood test to see if you have elevated eosinophils in your blood.  Please come by the lab in our basement to have this blood test drawn at your convenience.  I believe you told me before your colonoscopy that you are not bothered by any chronic gastrointestinal symptoms.  Is this correct?

## 2023-09-27 ENCOUNTER — Encounter: Admitting: Family Medicine

## 2023-10-04 ENCOUNTER — Ambulatory Visit (INDEPENDENT_AMBULATORY_CARE_PROVIDER_SITE_OTHER): Admitting: Family Medicine

## 2023-10-04 ENCOUNTER — Encounter: Payer: Self-pay | Admitting: Family Medicine

## 2023-10-04 VITALS — BP 126/86 | HR 79 | Temp 97.7°F | Ht 66.93 in | Wt 175.6 lb

## 2023-10-04 DIAGNOSIS — Z23 Encounter for immunization: Secondary | ICD-10-CM

## 2023-10-04 DIAGNOSIS — Z0000 Encounter for general adult medical examination without abnormal findings: Secondary | ICD-10-CM | POA: Diagnosis not present

## 2023-10-04 DIAGNOSIS — Z13220 Encounter for screening for lipoid disorders: Secondary | ICD-10-CM | POA: Diagnosis not present

## 2023-10-04 LAB — CBC WITH DIFFERENTIAL/PLATELET
Basophils Absolute: 0 K/uL (ref 0.0–0.1)
Basophils Relative: 0.8 % (ref 0.0–3.0)
Eosinophils Absolute: 0.3 K/uL (ref 0.0–0.7)
Eosinophils Relative: 5.9 % — ABNORMAL HIGH (ref 0.0–5.0)
HCT: 40.5 % (ref 39.0–52.0)
Hemoglobin: 13.6 g/dL (ref 13.0–17.0)
Lymphocytes Relative: 41.2 % (ref 12.0–46.0)
Lymphs Abs: 1.9 K/uL (ref 0.7–4.0)
MCHC: 33.5 g/dL (ref 30.0–36.0)
MCV: 89.9 fl (ref 78.0–100.0)
Monocytes Absolute: 0.5 K/uL (ref 0.1–1.0)
Monocytes Relative: 11.2 % (ref 3.0–12.0)
Neutro Abs: 1.9 K/uL (ref 1.4–7.7)
Neutrophils Relative %: 40.9 % — ABNORMAL LOW (ref 43.0–77.0)
Platelets: 285 K/uL (ref 150.0–400.0)
RBC: 4.51 Mil/uL (ref 4.22–5.81)
RDW: 12.5 % (ref 11.5–15.5)
WBC: 4.5 K/uL (ref 4.0–10.5)

## 2023-10-04 LAB — BASIC METABOLIC PANEL WITH GFR
BUN: 13 mg/dL (ref 6–23)
CO2: 26 meq/L (ref 19–32)
Calcium: 9 mg/dL (ref 8.4–10.5)
Chloride: 105 meq/L (ref 96–112)
Creatinine, Ser: 0.94 mg/dL (ref 0.40–1.50)
GFR: 88.54 mL/min (ref >60.00)
Glucose, Bld: 81 mg/dL (ref 70–99)
Potassium: 4 meq/L (ref 3.5–5.1)
Sodium: 139 meq/L (ref 135–145)

## 2023-10-04 LAB — HEPATIC FUNCTION PANEL
ALT: 14 U/L (ref 0–53)
AST: 17 U/L (ref 0–37)
Albumin: 4.2 g/dL (ref 3.5–5.2)
Alkaline Phosphatase: 74 U/L (ref 39–117)
Bilirubin, Direct: 0.2 mg/dL (ref 0.0–0.3)
Total Bilirubin: 1.1 mg/dL (ref 0.2–1.2)
Total Protein: 6.6 g/dL (ref 6.0–8.3)

## 2023-10-04 LAB — LIPID PANEL
Cholesterol: 177 mg/dL (ref 0–200)
HDL: 44.3 mg/dL (ref >39.00)
LDL Cholesterol: 116 mg/dL — ABNORMAL HIGH (ref 0–99)
NonHDL: 132.37
Total CHOL/HDL Ratio: 4
Triglycerides: 81 mg/dL (ref 0.0–149.0)
VLDL: 16.2 mg/dL (ref 0.0–40.0)

## 2023-10-04 LAB — PSA: PSA: 0.31 ng/mL (ref 0.10–4.00)

## 2023-10-04 NOTE — Addendum Note (Signed)
 Addended by: METTA KRISTEN CROME on: 10/04/2023 09:09 AM   Modules accepted: Orders

## 2023-10-04 NOTE — Progress Notes (Signed)
 Established Patient Office Visit  Subjective   Patient ID: Raymond Torres, male    DOB: [birth year]  Age: 58 y.o. MRN: [id number]  Chief Complaint  Patient presents with   Annual Exam    HPI   Raymond Torres is seen for physical exam.  Doing well overall.  Still working full-time as a Naval architect.  Stays very busy with that.  He does have history of prostate cancer with history of radiation seed implants.  Did have mild troponin elevation with associated chest pain June 2007.  He was admitted and had catheterization with normal coronaries.  Etiology of his troponin elevation was unclear at that time.  He had no chest pain since then.  Health maintenance reviewed:  Health Maintenance  Topic Date Due   HIV Screening  Never done   Hepatitis B Vaccines (1 of 3 - 19+ 3-dose series) Never done   COVID-19 Vaccine (3 - Pfizer risk series) 07/24/2019   Zoster Vaccines- Shingrix (1 of 2) 01/04/2024 (Originally 12/09/1984)   Pneumococcal Vaccine 21-62 Years old (2 of 2 - PCV) 08/29/2029 (Originally 10/19/2014)   INFLUENZA VACCINE  10/20/2023   DTaP/Tdap/Td (4 - Td or Tdap) 10/30/2025   Colonoscopy  08/15/2033   Hepatitis C Screening  Completed   HPV VACCINES  Aged Out   Meningococcal B Vaccine  Aged Out   - Just had colonoscopy which came out clear. -Tetanus due in 2 years -No history of pneumonia vaccine and he does consent to that today -Declines Shingrix vaccine at this time.  Social history-married with 3 children.  He has a son age 43 who is still at home.  He has daughters ages 64 and 79.  His oldest daughter is a Teacher, early years/pre .   Rare cigar use.  Rare alcohol.  Works as a Naval architect.  Drives mostly throughout [place]  Family history-his father had prostate cancer but died of complications of lung cancer.  Father was non-smoker.  He has a brother with history of arthritis which sounds like osteoarthritis.  Mother is alive and has hypertension but otherwise well.  No family history of  premature CAD or type 2 diabetes   Past Medical History:  Diagnosis Date   Allergic rhinitis    Asthma    followed by pcp   Frequency of urination    History of Helicobacter pylori infection    History of non-ST elevation myocardial infarction (NSTEMI) (01-25-2019  per pt has never any cardiac s&s since 2007)   09-13-2005 thought to be coronary spasm;  per cardiac cath normal coronary arteries, ef 60%, no wall motion abnormality   Hyperlipidemia    Hyperplasia of prostate with lower urinary tract symptoms (LUTS)    Nocturia    Prostate cancer [facility]) urologist--- dr bell/  oncologist--- dr patrcia   dx 11-29-2018--- Stage T1c,  Gleason 3+3   Wears glasses    Past Surgical History:  Procedure Laterality Date   ACHILLES TENDON REPAIR Left 08/2009   Dr Addie   CARDIAC CATHETERIZATION  09-13-2005 & 01-04-2006  dr g. taylor   angiographically normal coronary arteries,  ef 60%,  no wall motion abnormality, LVEDP 6 mmHg   PROSTATE BIOPSY  11-20-2018  dr bell office   RADIOACTIVE SEED IMPLANT N/A 01/28/2019   Procedure: RADIOACTIVE SEED IMPLANT/BRACHYTHERAPY IMPLANT;  Surgeon: Carolee Sherwood JONETTA DOUGLAS, MD;  Location: [facility];  Service: Urology;  Laterality: N/A;   SPACE OAR INSTILLATION N/A 01/28/2019   Procedure: SPACE OAR INSTILLATION;  Surgeon: Carolee Sherwood JONETTA DOUGLAS, MD;  Location: [facility];  Service: Urology;  Laterality: N/A;    reports that he has been smoking cigars. He has never used smokeless tobacco. He reports current alcohol use. He reports that he does not use drugs. family history includes Arthritis in his brother; Cancer in his father; Hypertension in his mother; Prostate cancer in his father, maternal uncle, and paternal uncle. Allergies  Allergen Reactions   Shellfish Allergy Hives    Shrimp, crabs      Review of Systems  Constitutional:  Negative for chills, fever and malaise/fatigue.  Eyes:  Negative for blurred vision.  Respiratory:   Negative for shortness of breath.   Cardiovascular:  Negative for chest pain.  Gastrointestinal:  Negative for abdominal pain.  Neurological:  Negative for dizziness, weakness and headaches.      Objective:     BP 126/86 (BP Location: Left Arm, Patient Position: Sitting, Cuff Size: Normal)   Pulse 79   Temp 97.7 F (36.5 C) (Oral)   Ht 5' 6.93 (1.7 m)   Wt 175 lb 9.6 oz (79.7 kg)   SpO2 98%   BMI 27.56 kg/m  BP Readings from Last 3 Encounters:  10/04/23 126/86  08/16/23 133/88  09/16/22 120/80   Wt Readings from Last 3 Encounters:  10/04/23 175 lb 9.6 oz (79.7 kg)  08/16/23 175 lb (79.4 kg)  07/26/23 175 lb (79.4 kg)      Physical Exam Vitals reviewed.  Constitutional:      General: He is not in acute distress.    Appearance: He is well-developed. He is not ill-appearing or toxic-appearing.  HENT:     Head: Normocephalic and atraumatic.     Right Ear: External ear normal.     Left Ear: External ear normal.  Eyes:     Conjunctiva/sclera: Conjunctivae normal.     Pupils: Pupils are equal, round, and reactive to light.  Neck:     Thyroid : No thyromegaly.  Cardiovascular:     Rate and Rhythm: Normal rate and regular rhythm.     Heart sounds: Normal heart sounds. No murmur heard. Pulmonary:     Effort: Pulmonary effort is normal. No respiratory distress.     Breath sounds: Normal breath sounds. No wheezing or rales.  Abdominal:     General: Bowel sounds are normal. There is no distension.     Palpations: Abdomen is soft. There is no mass.     Tenderness: There is no abdominal tenderness. There is no guarding or rebound.  Musculoskeletal:     Cervical back: Normal range of motion and neck supple.  Lymphadenopathy:     Cervical: No cervical adenopathy.  Skin:    Findings: No rash.  Neurological:     Mental Status: He is alert and oriented to person, place, and time.     Cranial Nerves: No cranial nerve deficit.      No results found for any visits on  10/04/23.    The ASCVD Risk score (Arnett DK, et al., 2019) failed to calculate for the following reasons:   Risk score cannot be calculated because patient has a medical history suggesting prior/existing ASCVD    Assessment & Plan:   Problem List Items Addressed This Visit   None Visit Diagnoses       Physical exam    -  Primary   Relevant Orders   Basic metabolic panel with GFR   Lipid panel   CBC with Differential/Platelet  Hepatic function panel   PSA     Past history of prostate cancer treated with radiation seed implants.  Check labs today including PSA.  We also discussed Prevnar 20 and patient consents.  Discussed Shingrix vaccine and he declines.  Needs tetanus booster in 2 years.  Recommend annual flu vaccine.  Just had repeat colonoscopy which came back completely normal.  No follow-ups on file.    Wolm Scarlet, MD

## 2023-10-06 ENCOUNTER — Ambulatory Visit: Payer: Self-pay | Admitting: Family Medicine

## 2024-03-23 ENCOUNTER — Ambulatory Visit: Payer: Self-pay

## 2024-04-22 ENCOUNTER — Ambulatory Visit
Admission: RE | Admit: 2024-04-22 | Discharge: 2024-04-22 | Disposition: A | Discharge status: Home / Self Care | Source: Ambulatory Visit

## 2024-04-22 VITALS — BP 137/89 | HR 79 | Temp 98.9°F | Resp 17

## 2024-04-22 DIAGNOSIS — B349 Viral infection, unspecified: Secondary | ICD-10-CM

## 2024-04-22 DIAGNOSIS — R519 Headache, unspecified: Secondary | ICD-10-CM | POA: Diagnosis not present

## 2024-04-22 DIAGNOSIS — R051 Acute cough: Secondary | ICD-10-CM | POA: Diagnosis not present

## 2024-04-22 LAB — POC SOFIA SARS ANTIGEN FIA: SARS Coronavirus 2 Ag: NEGATIVE

## 2024-04-22 MED ORDER — ACETAMINOPHEN 325 MG PO TABS
650.0000 mg | ORAL_TABLET | Freq: Once | ORAL | Status: AC
  Administered 2024-04-22: 650 mg via ORAL

## 2024-04-22 MED ORDER — BENZONATATE 200 MG PO CAPS: 200.0000 mg | ORAL_CAPSULE | Freq: Three times a day (TID) | ORAL | 0 refills | Status: AC | PRN

## 2024-04-22 MED ORDER — NAPROXEN 375 MG PO TABS: 375.0000 mg | ORAL_TABLET | Freq: Two times a day (BID) | ORAL | 0 refills | Status: AC | PRN

## 2024-04-22 NOTE — Discharge Instructions (Signed)
 You tested negative for COVID-19.  You may take Tessalon  3 times a day as needed for your cough.  You are given Tylenol  in the clinic for your headache and you may take naproxen  twice daily as needed for headaches or bodyaches.  Lots of rest and fluids.  Follow-up with your PCP in 2 to 3 days for recheck.  Please go to the emergency room for any worsening symptoms.  Hope you feel better soon!

## 2024-04-22 NOTE — ED Triage Notes (Signed)
 Pt present with c/o headache, cough, runny nose and chest congestion x 4  days.  Home interventions: alka seltzer tablets and Excedrin with no relief. Pt states he also took 800mg  of Ibuprofen 

## 2024-04-23 ENCOUNTER — Ambulatory Visit: Admitting: Family Medicine

## 2024-04-23 ENCOUNTER — Encounter: Payer: Self-pay | Admitting: Family Medicine

## 2024-04-23 VITALS — BP 130/70 | HR 86 | Temp 97.8°F | Wt 180.1 lb

## 2024-04-23 DIAGNOSIS — J069 Acute upper respiratory infection, unspecified: Secondary | ICD-10-CM

## 2024-04-23 MED ORDER — ALBUTEROL SULFATE HFA 108 (90 BASE) MCG/ACT IN AERS: 2.0000 | INHALATION_SPRAY | RESPIRATORY_TRACT | 1 refills | Status: AC | PRN

## 2024-04-23 MED ORDER — PREDNISONE 20 MG PO TABS: ORAL_TABLET | ORAL | 0 refills | Status: AC

## 2024-04-23 MED ORDER — HYDROCODONE BIT-HOMATROP MBR 5-1.5 MG/5ML PO SOLN: 5.0000 mL | Freq: Four times a day (QID) | ORAL | 0 refills | Status: AC | PRN
